# Patient Record
Sex: Female | Born: 1960 | Race: Black or African American | Hispanic: No | Marital: Married | Smoking: Never smoker
Health system: Southern US, Community
[De-identification: ages and names within clinical notes are randomized; demographics above are authoritative.]

## PROBLEM LIST (undated history)

## (undated) DIAGNOSIS — N189 Chronic kidney disease, unspecified: Secondary | ICD-10-CM

## (undated) DIAGNOSIS — D649 Anemia, unspecified: Secondary | ICD-10-CM

## (undated) DIAGNOSIS — I10 Essential (primary) hypertension: Secondary | ICD-10-CM

---

## 2016-07-07 ENCOUNTER — Inpatient Hospital Stay (HOSPITAL_COMMUNITY)
Admission: AD | Admit: 2016-07-07 | Discharge: 2016-07-15 | DRG: 070 | Disposition: A | Discharge status: Skilled Nursing Facility | Payer: Medicare HMO | Source: Other Acute Inpatient Hospital | Attending: Internal Medicine | Admitting: Internal Medicine

## 2016-07-07 DIAGNOSIS — J9602 Acute respiratory failure with hypercapnia: Secondary | ICD-10-CM | POA: Diagnosis not present

## 2016-07-07 DIAGNOSIS — L8990 Pressure ulcer of unspecified site, unspecified stage: Secondary | ICD-10-CM | POA: Diagnosis not present

## 2016-07-07 DIAGNOSIS — R0902 Hypoxemia: Secondary | ICD-10-CM

## 2016-07-07 DIAGNOSIS — I824Y3 Acute embolism and thrombosis of unspecified deep veins of proximal lower extremity, bilateral: Secondary | ICD-10-CM | POA: Diagnosis not present

## 2016-07-07 DIAGNOSIS — R8271 Bacteriuria: Secondary | ICD-10-CM | POA: Diagnosis not present

## 2016-07-07 DIAGNOSIS — Z6841 Body Mass Index (BMI) 40.0 and over, adult: Secondary | ICD-10-CM | POA: Diagnosis not present

## 2016-07-07 DIAGNOSIS — E875 Hyperkalemia: Secondary | ICD-10-CM | POA: Diagnosis present

## 2016-07-07 DIAGNOSIS — J9621 Acute and chronic respiratory failure with hypoxia: Secondary | ICD-10-CM | POA: Diagnosis present

## 2016-07-07 DIAGNOSIS — D72829 Elevated white blood cell count, unspecified: Secondary | ICD-10-CM

## 2016-07-07 DIAGNOSIS — E039 Hypothyroidism, unspecified: Secondary | ICD-10-CM | POA: Diagnosis present

## 2016-07-07 DIAGNOSIS — J9622 Acute and chronic respiratory failure with hypercapnia: Secondary | ICD-10-CM | POA: Diagnosis present

## 2016-07-07 DIAGNOSIS — F329 Major depressive disorder, single episode, unspecified: Secondary | ICD-10-CM | POA: Diagnosis not present

## 2016-07-07 DIAGNOSIS — N189 Chronic kidney disease, unspecified: Secondary | ICD-10-CM

## 2016-07-07 DIAGNOSIS — J9600 Acute respiratory failure, unspecified whether with hypoxia or hypercapnia: Secondary | ICD-10-CM

## 2016-07-07 DIAGNOSIS — E872 Acidosis: Secondary | ICD-10-CM | POA: Diagnosis present

## 2016-07-07 DIAGNOSIS — D631 Anemia in chronic kidney disease: Secondary | ICD-10-CM | POA: Diagnosis not present

## 2016-07-07 DIAGNOSIS — R253 Fasciculation: Secondary | ICD-10-CM

## 2016-07-07 DIAGNOSIS — I509 Heart failure, unspecified: Secondary | ICD-10-CM | POA: Diagnosis present

## 2016-07-07 DIAGNOSIS — G9341 Metabolic encephalopathy: Secondary | ICD-10-CM | POA: Diagnosis present

## 2016-07-07 DIAGNOSIS — I132 Hypertensive heart and chronic kidney disease with heart failure and with stage 5 chronic kidney disease, or end stage renal disease: Secondary | ICD-10-CM | POA: Diagnosis present

## 2016-07-07 DIAGNOSIS — J9601 Acute respiratory failure with hypoxia: Secondary | ICD-10-CM | POA: Diagnosis not present

## 2016-07-07 DIAGNOSIS — R4182 Altered mental status, unspecified: Secondary | ICD-10-CM | POA: Diagnosis not present

## 2016-07-07 DIAGNOSIS — A0472 Enterocolitis due to Clostridium difficile, not specified as recurrent: Secondary | ICD-10-CM | POA: Diagnosis present

## 2016-07-07 DIAGNOSIS — N179 Acute kidney failure, unspecified: Secondary | ICD-10-CM | POA: Diagnosis present

## 2016-07-07 DIAGNOSIS — Z9889 Other specified postprocedural states: Secondary | ICD-10-CM | POA: Diagnosis not present

## 2016-07-07 DIAGNOSIS — L89159 Pressure ulcer of sacral region, unspecified stage: Secondary | ICD-10-CM | POA: Diagnosis present

## 2016-07-07 DIAGNOSIS — S81801D Unspecified open wound, right lower leg, subsequent encounter: Secondary | ICD-10-CM | POA: Diagnosis not present

## 2016-07-07 DIAGNOSIS — N186 End stage renal disease: Secondary | ICD-10-CM | POA: Diagnosis present

## 2016-07-07 DIAGNOSIS — G4733 Obstructive sleep apnea (adult) (pediatric): Secondary | ICD-10-CM | POA: Diagnosis present

## 2016-07-07 DIAGNOSIS — G9340 Encephalopathy, unspecified: Secondary | ICD-10-CM | POA: Diagnosis not present

## 2016-07-07 DIAGNOSIS — R1110 Vomiting, unspecified: Secondary | ICD-10-CM

## 2016-07-07 DIAGNOSIS — D649 Anemia, unspecified: Secondary | ICD-10-CM | POA: Diagnosis not present

## 2016-07-07 DIAGNOSIS — Z452 Encounter for adjustment and management of vascular access device: Secondary | ICD-10-CM

## 2016-07-07 DIAGNOSIS — S81801A Unspecified open wound, right lower leg, initial encounter: Secondary | ICD-10-CM | POA: Diagnosis present

## 2016-07-07 HISTORY — DX: Anemia, unspecified: D64.9

## 2016-07-07 HISTORY — DX: Essential (primary) hypertension: I10

## 2016-07-07 HISTORY — DX: Chronic kidney disease, unspecified: N18.9

## 2016-07-08 ENCOUNTER — Inpatient Hospital Stay (HOSPITAL_COMMUNITY): Payer: Medicare HMO

## 2016-07-08 ENCOUNTER — Encounter (HOSPITAL_COMMUNITY): Payer: Self-pay | Admitting: Internal Medicine

## 2016-07-08 DIAGNOSIS — G9340 Encephalopathy, unspecified: Secondary | ICD-10-CM | POA: Diagnosis present

## 2016-07-08 DIAGNOSIS — S81801A Unspecified open wound, right lower leg, initial encounter: Secondary | ICD-10-CM | POA: Diagnosis present

## 2016-07-08 DIAGNOSIS — E039 Hypothyroidism, unspecified: Secondary | ICD-10-CM | POA: Diagnosis present

## 2016-07-08 DIAGNOSIS — D631 Anemia in chronic kidney disease: Secondary | ICD-10-CM | POA: Diagnosis present

## 2016-07-08 DIAGNOSIS — N186 End stage renal disease: Secondary | ICD-10-CM

## 2016-07-08 DIAGNOSIS — N179 Acute kidney failure, unspecified: Secondary | ICD-10-CM | POA: Diagnosis present

## 2016-07-08 DIAGNOSIS — N189 Chronic kidney disease, unspecified: Secondary | ICD-10-CM

## 2016-07-08 DIAGNOSIS — E875 Hyperkalemia: Secondary | ICD-10-CM

## 2016-07-08 LAB — CBC WITH DIFFERENTIAL/PLATELET
BASOS PCT: 1 %
Basophils Absolute: 0.1 10*3/uL (ref 0.0–0.1)
EOS PCT: 1 %
Eosinophils Absolute: 0.1 10*3/uL (ref 0.0–0.7)
HEMATOCRIT: 30.6 % — AB (ref 36.0–46.0)
Hemoglobin: 9.2 g/dL — ABNORMAL LOW (ref 12.0–15.0)
LYMPHS ABS: 1.6 10*3/uL (ref 0.7–4.0)
Lymphocytes Relative: 15 %
MCH: 25.3 pg — ABNORMAL LOW (ref 26.0–34.0)
MCHC: 30.1 g/dL (ref 30.0–36.0)
MCV: 84.3 fL (ref 78.0–100.0)
MONO ABS: 0.6 10*3/uL (ref 0.1–1.0)
Monocytes Relative: 6 %
Neutro Abs: 8.3 10*3/uL — ABNORMAL HIGH (ref 1.7–7.7)
Neutrophils Relative %: 77 %
Platelets: 620 10*3/uL — ABNORMAL HIGH (ref 150–400)
RBC: 3.63 MIL/uL — ABNORMAL LOW (ref 3.87–5.11)
RDW: 22.3 % — AB (ref 11.5–15.5)
WBC: 10.7 10*3/uL — AB (ref 4.0–10.5)

## 2016-07-08 LAB — HEPATIC FUNCTION PANEL
ALT: 10 U/L — ABNORMAL LOW (ref 14–54)
AST: 26 U/L (ref 15–41)
Albumin: 2.4 g/dL — ABNORMAL LOW (ref 3.5–5.0)
Alkaline Phosphatase: 84 U/L (ref 38–126)
BILIRUBIN TOTAL: 0.4 mg/dL (ref 0.3–1.2)
Total Protein: 7.5 g/dL (ref 6.5–8.1)

## 2016-07-08 LAB — MRSA PCR SCREENING: MRSA by PCR: NEGATIVE

## 2016-07-08 LAB — RENAL FUNCTION PANEL
Albumin: 2.4 g/dL — ABNORMAL LOW (ref 3.5–5.0)
Anion gap: 13 (ref 5–15)
BUN: 90 mg/dL — AB (ref 6–20)
CO2: 24 mmol/L (ref 22–32)
CREATININE: 5.37 mg/dL — AB (ref 0.44–1.00)
Calcium: 6.8 mg/dL — ABNORMAL LOW (ref 8.9–10.3)
Chloride: 93 mmol/L — ABNORMAL LOW (ref 101–111)
GFR calc Af Amer: 9 mL/min — ABNORMAL LOW (ref >60)
GFR calc non Af Amer: 8 mL/min — ABNORMAL LOW (ref >60)
GLUCOSE: 96 mg/dL (ref 65–99)
PHOSPHORUS: 10 mg/dL — AB (ref 2.5–4.6)
Potassium: 6.1 mmol/L — ABNORMAL HIGH (ref 3.5–5.1)
Sodium: 130 mmol/L — ABNORMAL LOW (ref 135–145)

## 2016-07-08 LAB — BASIC METABOLIC PANEL
ANION GAP: 12 (ref 5–15)
Anion gap: 15 (ref 5–15)
BUN: 87 mg/dL — AB (ref 6–20)
BUN: 88 mg/dL — ABNORMAL HIGH (ref 6–20)
CALCIUM: 6.9 mg/dL — AB (ref 8.9–10.3)
CHLORIDE: 92 mmol/L — AB (ref 101–111)
CO2: 22 mmol/L (ref 22–32)
CO2: 26 mmol/L (ref 22–32)
CREATININE: 5.61 mg/dL — AB (ref 0.44–1.00)
Calcium: 6.9 mg/dL — ABNORMAL LOW (ref 8.9–10.3)
Chloride: 93 mmol/L — ABNORMAL LOW (ref 101–111)
Creatinine, Ser: 5.32 mg/dL — ABNORMAL HIGH (ref 0.44–1.00)
GFR calc Af Amer: 9 mL/min — ABNORMAL LOW (ref >60)
GFR calc non Af Amer: 8 mL/min — ABNORMAL LOW (ref >60)
GFR calc non Af Amer: 8 mL/min — ABNORMAL LOW (ref >60)
GFR, EST AFRICAN AMERICAN: 10 mL/min — AB (ref >60)
Glucose, Bld: 106 mg/dL — ABNORMAL HIGH (ref 65–99)
Glucose, Bld: 98 mg/dL (ref 65–99)
POTASSIUM: 6.1 mmol/L — AB (ref 3.5–5.1)
Potassium: 6.9 mmol/L (ref 3.5–5.1)
SODIUM: 129 mmol/L — AB (ref 135–145)
Sodium: 131 mmol/L — ABNORMAL LOW (ref 135–145)

## 2016-07-08 LAB — CBC
HEMATOCRIT: 30.4 % — AB (ref 36.0–46.0)
HEMATOCRIT: 31.3 % — AB (ref 36.0–46.0)
HEMOGLOBIN: 9.2 g/dL — AB (ref 12.0–15.0)
Hemoglobin: 9 g/dL — ABNORMAL LOW (ref 12.0–15.0)
MCH: 25.6 pg — AB (ref 26.0–34.0)
MCH: 24.7 pg — ABNORMAL LOW (ref 26.0–34.0)
MCHC: 30.3 g/dL (ref 30.0–36.0)
MCHC: 28.8 g/dL — AB (ref 30.0–36.0)
MCV: 84.4 fL (ref 78.0–100.0)
MCV: 86 fL (ref 78.0–100.0)
PLATELETS: 610 10*3/uL — AB (ref 150–400)
Platelets: 604 10*3/uL — ABNORMAL HIGH (ref 150–400)
RBC: 3.6 MIL/uL — ABNORMAL LOW (ref 3.87–5.11)
RBC: 3.64 MIL/uL — ABNORMAL LOW (ref 3.87–5.11)
RDW: 22.4 % — AB (ref 11.5–15.5)
RDW: 22.7 % — AB (ref 11.5–15.5)
WBC: 10 10*3/uL (ref 4.0–10.5)
WBC: 9.3 10*3/uL (ref 4.0–10.5)

## 2016-07-08 LAB — HIV ANTIBODY (ROUTINE TESTING W REFLEX): HIV SCREEN 4TH GENERATION: NONREACTIVE

## 2016-07-08 LAB — LACTIC ACID, PLASMA: LACTIC ACID, VENOUS: 0.9 mmol/L (ref 0.5–1.9)

## 2016-07-08 LAB — BLOOD GAS, ARTERIAL
ACID-BASE DEFICIT: 2.5 mmol/L — AB (ref 0.0–2.0)
BICARBONATE: 23.9 mmol/L (ref 20.0–28.0)
Drawn by: 365271
FIO2: 21
O2 SAT: 84.1 %
PATIENT TEMPERATURE: 98.6
PO2 ART: 55.2 mmHg — AB (ref 83.0–108.0)
pCO2 arterial: 58.3 mmHg — ABNORMAL HIGH (ref 32.0–48.0)
pH, Arterial: 7.237 — ABNORMAL LOW (ref 7.350–7.450)

## 2016-07-08 LAB — GLUCOSE, CAPILLARY
GLUCOSE-CAPILLARY: 114 mg/dL — AB (ref 65–99)
GLUCOSE-CAPILLARY: 95 mg/dL (ref 65–99)
Glucose-Capillary: 101 mg/dL — ABNORMAL HIGH (ref 65–99)
Glucose-Capillary: 106 mg/dL — ABNORMAL HIGH (ref 65–99)
Glucose-Capillary: 106 mg/dL — ABNORMAL HIGH (ref 65–99)

## 2016-07-08 LAB — PROCALCITONIN: Procalcitonin: 1.56 ng/mL

## 2016-07-08 LAB — TSH: TSH: 13.595 u[IU]/mL — ABNORMAL HIGH (ref 0.350–4.500)

## 2016-07-08 LAB — AMMONIA: Ammonia: 29 umol/L (ref 9–35)

## 2016-07-08 LAB — HCG, SERUM, QUALITATIVE: PREG SERUM: NEGATIVE

## 2016-07-08 LAB — BRAIN NATRIURETIC PEPTIDE: B Natriuretic Peptide: 76.1 pg/mL (ref 0.0–100.0)

## 2016-07-08 MED ORDER — ALTEPLASE 2 MG IJ SOLR: 2.0000 mg | Freq: Once | INTRAMUSCULAR | Status: DC | PRN

## 2016-07-08 MED ORDER — ONDANSETRON HCL 4 MG PO TABS
4.0000 mg | ORAL_TABLET | Freq: Four times a day (QID) | ORAL | Status: DC | PRN
  Administered 2016-07-13: 4 mg via ORAL
  Filled 2016-07-08: qty 1

## 2016-07-08 MED ORDER — DEXTROSE 50 % IV SOLN
1.0000 | Freq: Once | INTRAVENOUS | Status: AC
  Administered 2016-07-08: 50 mL via INTRAVENOUS
  Filled 2016-07-08: qty 50

## 2016-07-08 MED ORDER — HEPARIN SODIUM (PORCINE) 1000 UNIT/ML DIALYSIS: 1000.0000 [IU] | INTRAMUSCULAR | Status: DC | PRN

## 2016-07-08 MED ORDER — LIDOCAINE HCL (PF) 1 % IJ SOLN: 5.0000 mL | INTRAMUSCULAR | Status: DC | PRN

## 2016-07-08 MED ORDER — ONDANSETRON HCL 4 MG/2ML IJ SOLN
4.0000 mg | Freq: Four times a day (QID) | INTRAMUSCULAR | Status: DC | PRN
  Administered 2016-07-12 – 2016-07-13 (×3): 4 mg via INTRAVENOUS
  Filled 2016-07-08 (×4): qty 2

## 2016-07-08 MED ORDER — CALCIUM GLUCONATE 10 % IV SOLN: 1.0000 g | Freq: Once | INTRAVENOUS | Status: DC

## 2016-07-08 MED ORDER — LIDOCAINE-PRILOCAINE 2.5-2.5 % EX CREA
1.0000 "application " | TOPICAL_CREAM | CUTANEOUS | Status: DC | PRN
  Filled 2016-07-08: qty 5

## 2016-07-08 MED ORDER — ACETAMINOPHEN 325 MG PO TABS
650.0000 mg | ORAL_TABLET | Freq: Four times a day (QID) | ORAL | Status: DC | PRN
  Administered 2016-07-09 – 2016-07-12 (×6): 650 mg via ORAL
  Filled 2016-07-08 (×6): qty 2

## 2016-07-08 MED ORDER — PENTAFLUOROPROP-TETRAFLUOROETH EX AERO: 1.0000 "application " | INHALATION_SPRAY | CUTANEOUS | Status: DC | PRN

## 2016-07-08 MED ORDER — FENTANYL CITRATE (PF) 100 MCG/2ML IJ SOLN
INTRAMUSCULAR | Status: AC
  Filled 2016-07-08: qty 2

## 2016-07-08 MED ORDER — LEVOTHYROXINE SODIUM 100 MCG IV SOLR
100.0000 ug | Freq: Every day | INTRAVENOUS | Status: DC
  Administered 2016-07-08 – 2016-07-12 (×5): 100 ug via INTRAVENOUS
  Filled 2016-07-08 (×5): qty 5

## 2016-07-08 MED ORDER — ACETAMINOPHEN 650 MG RE SUPP: 650.0000 mg | Freq: Four times a day (QID) | RECTAL | Status: DC | PRN

## 2016-07-08 MED ORDER — SODIUM POLYSTYRENE SULFONATE 15 GM/60ML PO SUSP
45.0000 g | Freq: Once | ORAL | Status: AC
  Administered 2016-07-08: 45 g via RECTAL
  Filled 2016-07-08: qty 180

## 2016-07-08 MED ORDER — SODIUM CHLORIDE 0.9 % IV SOLN: 100.0000 mL | INTRAVENOUS | Status: DC | PRN

## 2016-07-08 MED ORDER — INSULIN ASPART 100 UNIT/ML IV SOLN
5.0000 [IU] | Freq: Once | INTRAVENOUS | Status: AC
  Administered 2016-07-08: 5 [IU] via INTRAVENOUS

## 2016-07-08 MED ORDER — SODIUM CHLORIDE 0.9 % IV SOLN
1.0000 g | Freq: Once | INTRAVENOUS | Status: AC
  Administered 2016-07-08: 1 g via INTRAVENOUS
  Filled 2016-07-08: qty 10

## 2016-07-08 MED ORDER — INSULIN ASPART 100 UNIT/ML ~~LOC~~ SOLN
0.0000 [IU] | SUBCUTANEOUS | Status: DC
  Administered 2016-07-15: 1 [IU] via SUBCUTANEOUS

## 2016-07-08 MED ORDER — HEPARIN SODIUM (PORCINE) 5000 UNIT/ML IJ SOLN
5000.0000 [IU] | Freq: Three times a day (TID) | INTRAMUSCULAR | Status: DC
  Administered 2016-07-08 – 2016-07-12 (×13): 5000 [IU] via SUBCUTANEOUS
  Filled 2016-07-08 (×13): qty 1

## 2016-07-08 NOTE — Progress Notes (Signed)
Pt admitted to unit from 2W @0345 . Pt has multiple wounds to buttocks, inner/outer thighs, abdominal folds and bilateral groins.FOam dressings applied as best as possible.

## 2016-07-08 NOTE — Progress Notes (Signed)
PROGRESS NOTE                                                                                                                                                                                                             Diamond Parsons Demographics:    Diamond Parsons, is a 56 y.o. female, DOB - 16-Jul-1960, ZOX:[id number]  Admit date - 07/07/2016   Admitting Physician Eduard Clos, MD  Outpatient Primary MD for the Diamond Parsons is Diamond Parsons, No Pcp Per  LOS - 1   No chief complaint on file.      Brief Narrative   Diamond Parsons admitted earlier today by Dr. Georgetta Haber, chart, labs, imaging were reviewed   Subjective:    Diamond Parsons today Is confused and lethargic, cannot provide any complaints .   Assessment  & Plan :    Principal Problem:   Acute encephalopathy Active Problems:   Acute on chronic renal failure (HCC)   Leg wound, right   Hypothyroidism   Hyperkalemia   Anemia due to end stage renal disease (HCC)   Acute hypoxic/hypercapnic respiratory failure - Continue with BiPAP when necessary  Acute renal failure - Unclear baseline, even though recent hospitalization at [facility], appears no recent labs in system, records were requested - She was on hydrochlorothiazide, Timoptic, lisinopril, which was certainly contributing to her anal failure, she required dialysis during hospital stay and [place], but there was no need for further dialysis on discharge, and with evidence of uremia, hyperkalemia, hemodialysis will be started today. Renal.  Sacral decubitus ulcer - Continue with wound care,   Peripheral vascular disease with right foot amputation - Wound with some dehiscence , wound care consulted  Encephalopathy - Metabolic versus uremic  Hypothyroidism - Continue Synthroid  Diabetes mellitus - Continue with aspirin sliding scale  Hypertension - blood Pressure is acceptable off medication  Code Status : Full  Family  Communication  : Discussed with son via phone  Disposition Plan  : Back to SNF once stable   Consults  : Renal , PCCM  Procedures  : Temporary dialysis catheter insertion  DVT Prophylaxis  :   Heparin   Lab Results  Component Value Date   PLT 610 (H) 07/08/2016    Antibiotics  :    Anti-infectives    None  Objective:   Vitals:   07/08/16 1000 07/08/16 1215 07/08/16 1220 07/08/16 1230  BP: 121/72 112/63 108/72 107/72  Pulse: 95 96    Resp: 14 14 (!) 6 13  Temp:    97.5 F (36.4 C)  TempSrc:    Oral  SpO2: 100% 100%    Weight:      Height:        Wt Readings from Last 3 Encounters:  07/08/16 (!) 201.4 kg (444 lb)     Intake/Output Summary (Last 24 hours) at 07/08/16 1337 Last data filed at 07/08/16 0615  Gross per 24 hour  Intake              110 ml  Output              450 ml  Net             -340 ml     Physical Exam  Awake , Opens eyes, lethargic and confused , on BiPAP  Symmetrical Chest wall movement, distant respiratory sounds, diminished air entry at the bases, some crackles at the bases RRR,No Gallops,Rubs or new Murmurs, No Parasternal Heave +ve B.Sounds, Abd Soft, No tenderness, mildly obese abdomen, No rebound - guarding or rigidity. +1 edema and left lower extremity, right leg bandaged status post foot amputation    Data Review:    CBC  Recent Labs Lab 07/08/16 0128 07/08/16 0753 07/08/16 1102  WBC 10.7* 10.0 9.3  HGB 9.2* 9.2* 9.0*  HCT 30.6* 30.4* 31.3*  PLT 620* 604* 610*  MCV 84.3 84.4 86.0  MCH 25.3* 25.6* 24.7*  MCHC 30.1 30.3 28.8*  RDW 22.3* 22.4* 22.7*  LYMPHSABS 1.6  --   --   MONOABS 0.6  --   --   EOSABS 0.1  --   --   BASOSABS 0.1  --   --     Chemistries   Recent Labs Lab 07/08/16 0128 07/08/16 0753 07/08/16 1102  NA 129* 131* 130*  K 6.9* 6.1* 6.1*  CL 92* 93* 93*  CO2 22 26 24   GLUCOSE 106* 98 96  BUN 87* 88* 90*  CREATININE 5.61* 5.32* 5.37*  CALCIUM 6.9* 6.9* 6.8*  AST 26  --   --     ALT 10*  --   --   ALKPHOS 84  --   --   BILITOT 0.4  --   --    ------------------------------------------------------------------------------------------------------------------ No results for input(s): CHOL, HDL, LDLCALC, TRIG, CHOLHDL, LDLDIRECT in the last 72 hours.  No results found for: HGBA1C ------------------------------------------------------------------------------------------------------------------  Recent Labs  07/08/16 0753  TSH 13.595*   ------------------------------------------------------------------------------------------------------------------ No results for input(s): VITAMINB12, FOLATE, FERRITIN, TIBC, IRON, RETICCTPCT in the last 72 hours.  Coagulation profile No results for input(s): INR, PROTIME in the last 168 hours.  No results for input(s): DDIMER in the last 72 hours.  Cardiac Enzymes No results for input(s): CKMB, TROPONINI, MYOGLOBIN in the last 168 hours.  Invalid input(s): CK ------------------------------------------------------------------------------------------------------------------    Component Value Date/Time   BNP 76.1 07/08/2016 0753    Inpatient Medications  Scheduled Meds: . fentaNYL      . heparin  5,000 Units Subcutaneous Q8H  . insulin aspart  0-9 Units Subcutaneous Q4H  . levothyroxine  100 mcg Intravenous Daily   Continuous Infusions: . sodium chloride    . sodium chloride     PRN Meds:.sodium chloride, sodium chloride, acetaminophen **OR** acetaminophen, alteplase, heparin, lidocaine (PF), lidocaine-prilocaine, ondansetron **OR** ondansetron (ZOFRAN) IV, pentafluoroprop-tetrafluoroeth  Micro Results Recent Results (from the past 240 hour(s))  MRSA PCR Screening     Status: None   Collection Time: 07/08/16  4:18 AM  Result Value Ref Range Status   MRSA by PCR NEGATIVE NEGATIVE Final    Comment:        The GeneXpert MRSA Assay (FDA approved for NASAL specimens only), is one component of a comprehensive  MRSA colonization surveillance program. It is not intended to diagnose MRSA infection nor to guide or monitor treatment for MRSA infections.     Radiology Reports Dg Chest Port 1 View  Result Date: 07/08/2016 CLINICAL DATA:  Encounter for central line placement. EXAM: PORTABLE CHEST 1 VIEW COMPARISON:  Radiograph of same day. FINDINGS: Stable cardiomegaly with central pulmonary vascular congestion. No pneumothorax or pleural effusion is noted. Interval placement of right internal jugular catheter with distal tip in expected position of SVC. Bony thorax is unremarkable. IMPRESSION: Stable cardiomegaly with central pulmonary vascular congestion. Interval placement of right internal jugular catheter with distal tip in expected position of SVC. Electronically Signed   By: Lupita RaiderJames  Green Jr, M.D.   On: 07/08/2016 13:16   Dg Chest Port 1 View  Result Date: 07/08/2016 CLINICAL DATA:  Hypoxia EXAM: PORTABLE CHEST 1 VIEW COMPARISON:  None. FINDINGS: There is no edema or consolidation. There is cardiomegaly with pulmonary venous hypertension. No adenopathy. Moderate air is noted in the upper esophagus. Visualized trachea appears unremarkable. No bone lesions. IMPRESSION: Evidence pulmonary vascular congestion without edema or consolidation. Air in upper esophagus of uncertain etiology. Electronically Signed   By: Bretta BangWilliam  Woodruff III M.D.   On: 07/08/2016 08:11     Keyarra Rendall M.D on 07/08/2016 at 1:37 PM  Between 7am to 7pm - Pager - [phone]  After 7pm go to [email or web address] - password [facility]  [facility] -  Office  [phone]

## 2016-07-08 NOTE — Progress Notes (Signed)
Called pt's son that [facility]

## 2016-07-08 NOTE — Progress Notes (Signed)
Dr. Randol KernElgergawy notified of wound consult nurse recommendations (see consult note). Also notified HD cath placed at bedside and plan for dialysis at bedside this afternoon.

## 2016-07-08 NOTE — Progress Notes (Signed)
eLink Physician-Brief Progress Note Patient Name: Diamond Parsons DOB: 10/28/1960 MRN: [id number]   Date of Service  07/08/2016  HPI/Events of Note  PCCM consulted for encephalopathy and biPAP 56 yo morbidly obese female with progressive renal failure with high K with acute encephalopathy from metabolic and resp acidosis  and uremia Placed on biPAP, patient will need HD   eICU Interventions  Step down status BiPAP Needs HD     Intervention Category Evaluation Type: New Patient Evaluation  Diamond Parsons 07/08/2016, 5:27 AM

## 2016-07-08 NOTE — Procedures (Addendum)
Hemodialysis Catheter Insertion Procedure Note Diamond Parsons [id number] 07/22/1960  Procedure: Insertion of Hemodialysis Catheter Indications: Hemodialysis  Procedure Details Consent: Risks of procedure as well as the alternatives and risks of each were explained to the (patient/caregiver).  Consent for procedure obtained.  Time Out: Verified patient identification, verified procedure, site/side was marked, verified correct patient position, special equipment/implants available, medications/allergies/relevent history reviewed, required imaging and test results available.  Performed  Maximum sterile technique was used including antiseptics, cap, gloves, gown, hand hygiene, mask and sheet.  Skin prep: Chlorhexidine; local anesthetic administered  A Trialysis HD catheter was placed in the right internal jugular vein using the Seldinger technique.  Evaluation Blood flow good Complications: No apparent complications Patient did tolerate procedure well. Chest X-ray ordered to verify placement.  CXR: pending.   Procedure performed with ultrasound guidance for real time vessel cannulation.     Diamond KussmaulKatalina Parsons, AG-ACNP [facility]  Pgr: [phone]  PCCM Pgr: [phone]  Attending note: I was present for the procedure  Chilton GreathousePraveen Raychel Dowler MD [facility] Pager [phone] If no answer or after 3pm call: [phone] 07/09/2016, 8:21 AM

## 2016-07-08 NOTE — Progress Notes (Signed)
Pt transferred up to 4N07 due to needing higher level of care. She was accompanied by RN, nurse tech, and respiratory. Pt was stable during transfer and received by pt's nurse.

## 2016-07-08 NOTE — Consult Note (Addendum)
WOC Nurse wound consult note Reason for Consult: surgical leg wound, IAD perineal and buttocks areas, MASD skin folds due to morbid obesity. Wound type:partial thickness, incisional, MASD, IAD Pressure Injury POA: Yes, left buttock Measurement:Right transmetarsal amp inc site: 15cm inc (24 staples) close approximation except area 3cm from medial edge has 0.5 round bleeding area with black discoloration around inc edges, greater at the lateral edge with a 4cm hard necrotic area. Perineal and buttocks have more than a dozen partial thickness wounds from incontinence and moisture in skin folds with the largest being 4cm x 3cm x 0.1cm and a string of partial thickness wounds on the left buttock in the shape of a bedpan. Right groin has two partial thickness questionable surgical site from previous admission or maybe previous hemo dialysis cath site. Distal portion is 3.5cm x 3.0cm x 0.2cm proximal portion 1.5 round, red granulating tissue, yellow drainage, no odor. Left medial thigh 6cm x 0.5cm slit in skin, red tissue, no drainage or odor. Wound bed:see above Drainage (amount, consistency, odor) see above Periwound:intact Dressing procedure/placement/frequency: I have provided nurses with orders for NS wet to dry dressing for right groin, House antimicrobial for left groin, sub panicular and inframammary areas. I have ordered 3 step cleansing regime for IAD on buttocks and perineal areas with purple top Criticaid lotion. Pt is currently on an air mattress in ICU and will need one ordered when moves out to the floor. Prevalon boots ordered bilaterally. Patient may benefit from consultation with either VVS or Orthopedic Surgery for ongoing evaluation of right foot incision line. If you agree, please order. We will not follow, but will remain available to this patient, to nursing, and the medical and/or surgical teams.  Please re-consult if we need to assist further.    Barnett HatterMelinda Jams Trickett, RN-C, WTA-C Wound  Treatment Associate

## 2016-07-08 NOTE — Consult Note (Signed)
[facility] Consult Note     Date: 07/08/2016                  Patient Name:  Diamond Parsons  MRN: [id number]  DOB: 1960-02-15  Age / Sex: 56 y.o., female         PCP: Patient, No Pcp Per                 Service Requesting Consult: [facility], Dr. Waldron Labs                 Reason for Consult: AKI and hyperkalemia            Chief Complaint: acute encephalopathy  HPI: Pt is a 23F with a PMH of HTN, CKD, morbid obesity, and an episode of AKI previously requiring dialysis who is now seen at the request of Dr. Waldron Labs for eval and recommendations surrounding AKI, encephalopathy, and hyperkalemia.    Pt is encephalopathic and is unable to provide any history.  There is not a lot from the paper chart either.    Apparently she presented from a SNF to [facility] for R shoulder pain and chest pain and encephalopathy. She was found to have AKI with Cr of of 6.20 and a K of 6.6.  This was medically treated and she was transferred here.    Her K is 6.9 and Cr is 5.8.  Has been making some urine.  Has been hypercarbic and is now on BiPaP.  No details are available about her previous dialysis sessions.  These were apparently in the setting of a R foot infection.  MAR from SNF denotes HCTZ, lisinopril, and Mobic.  Past Medical History:  Diagnosis Date  . Anemia   . Chronic kidney disease   . Hypertension     History reviewed. No pertinent surgical history.  Family History  Problem Relation Age of Onset  . Family history unknown: Yes   Social History:  reports that she has never smoked. She has never used smokeless tobacco. Her alcohol and drug histories are not on file.  Allergies: Not on File  No prescriptions prior to admission.    Results for orders placed or performed during the hospital encounter of 07/07/16 (from the past 48 hour(s))  Basic metabolic panel     Status: Abnormal   Collection Time: 07/08/16  1:28 AM  Result Value Ref Range   Sodium 129 (L) 135 -  145 mmol/L   Potassium 6.9 (HH) 3.5 - 5.1 mmol/L    Comment: NO VISIBLE HEMOLYSIS CRITICAL RESULT CALLED TO, READ BACK BY AND VERIFIED WITH: S.LABANG,RN 0225 07/08/16 M.CAMPBELL    Chloride 92 (L) 101 - 111 mmol/L   CO2 22 22 - 32 mmol/L   Glucose, Bld 106 (H) 65 - 99 mg/dL   BUN 87 (H) 6 - 20 mg/dL   Creatinine, Ser 5.61 (H) 0.44 - 1.00 mg/dL   Calcium 6.9 (L) 8.9 - 10.3 mg/dL   GFR calc non Af Amer 8 (L) >60 mL/min   GFR calc Af Amer 9 (L) >60 mL/min    Comment: (NOTE) The eGFR has been calculated using the CKD EPI equation. This calculation has not been validated in all clinical situations. eGFR's persistently <60 mL/min signify possible Chronic Kidney Disease.    Anion gap 15 5 - 15  CBC with Differential/Platelet     Status: Abnormal   Collection Time: 07/08/16  1:28 AM  Result Value Ref Range   WBC 10.7 (H) 4.0 -  10.5 K/uL    Comment: CORRECTED ON 06/21 AT 0143: PREVIOUSLY REPORTED AS 10.2   RBC 3.63 (L) 3.87 - 5.11 MIL/uL    Comment: CORRECTED ON 06/21 AT 0143: PREVIOUSLY REPORTED AS 3.66   Hemoglobin 9.2 (L) 12.0 - 15.0 g/dL    Comment: CORRECTED ON 06/21 AT 0143: PREVIOUSLY REPORTED AS 9.4   HCT 30.6 (L) 36.0 - 46.0 %    Comment: CORRECTED ON 06/21 AT 0143: PREVIOUSLY REPORTED AS 31.2   MCV 84.3 78.0 - 100.0 fL    Comment: CORRECTED ON 06/21 AT 0143: PREVIOUSLY REPORTED AS 85.2   MCH 25.3 (L) 26.0 - 34.0 pg    Comment: CORRECTED ON 06/21 AT 0143: PREVIOUSLY REPORTED AS 25.7   MCHC 30.1 30.0 - 36.0 g/dL   RDW 22.3 (H) 11.5 - 15.5 %    Comment: CORRECTED ON 06/21 AT 0143: PREVIOUSLY REPORTED AS 22.6   Platelets 620 (H) 150 - 400 K/uL    Comment: CORRECTED ON 06/21 AT 0143: PREVIOUSLY REPORTED AS 619   Neutrophils Relative % 77 %   Lymphocytes Relative 15 %   Monocytes Relative 6 %   Eosinophils Relative 1 %   Basophils Relative 1 %   Neutro Abs 8.3 (H) 1.7 - 7.7 K/uL   Lymphs Abs 1.6 0.7 - 4.0 K/uL   Monocytes Absolute 0.6 0.1 - 1.0 K/uL   Eosinophils Absolute  0.1 0.0 - 0.7 K/uL   Basophils Absolute 0.1 0.0 - 0.1 K/uL   RBC Morphology POLYCHROMASIA PRESENT     Comment: RARE NRBCs   WBC Morphology MILD LEFT SHIFT (1-5% METAS, OCC MYELO, OCC BANDS)   Hepatic function panel     Status: Abnormal   Collection Time: 07/08/16  1:28 AM  Result Value Ref Range   Total Protein 7.5 6.5 - 8.1 g/dL   Albumin 2.4 (L) 3.5 - 5.0 g/dL   AST 26 15 - 41 U/L   ALT 10 (L) 14 - 54 U/L   Alkaline Phosphatase 84 38 - 126 U/L   Total Bilirubin 0.4 0.3 - 1.2 mg/dL   Bilirubin, Direct <0.1 (L) 0.1 - 0.5 mg/dL   Indirect Bilirubin NOT CALCULATED 0.3 - 0.9 mg/dL  Lactic acid, plasma     Status: None   Collection Time: 07/08/16  1:28 AM  Result Value Ref Range   Lactic Acid, Venous 0.9 0.5 - 1.9 mmol/L  hCG, serum, qualitative     Status: None   Collection Time: 07/08/16  1:28 AM  Result Value Ref Range   Preg, Serum NEGATIVE NEGATIVE    Comment:        THE SENSITIVITY OF THIS METHODOLOGY IS >10 mIU/mL.   Blood gas, arterial     Status: Abnormal   Collection Time: 07/08/16  1:28 AM  Result Value Ref Range   FIO2 21.00    pH, Arterial 7.237 (L) 7.350 - 7.450   pCO2 arterial 58.3 (H) 32.0 - 48.0 mmHg   pO2, Arterial 55.2 (L) 83.0 - 108.0 mmHg   Bicarbonate 23.9 20.0 - 28.0 mmol/L   Acid-base deficit 2.5 (H) 0.0 - 2.0 mmol/L   O2 Saturation 84.1 %   Patient temperature 98.6    Collection site LEFT RADIAL    Drawn by [id number]    Sample type ARTERIAL DRAW    Allens test (pass/fail) PASS PASS  MRSA PCR Screening     Status: None   Collection Time: 07/08/16  4:18 AM  Result Value Ref Range   MRSA by  PCR NEGATIVE NEGATIVE    Comment:        The GeneXpert MRSA Assay (FDA approved for NASAL specimens only), is one component of a comprehensive MRSA colonization surveillance program. It is not intended to diagnose MRSA infection nor to guide or monitor treatment for MRSA infections.   Glucose, capillary     Status: Abnormal   Collection Time: 07/08/16  4:40  AM  Result Value Ref Range   Glucose-Capillary 114 (H) 65 - 99 mg/dL  Glucose, capillary     Status: Abnormal   Collection Time: 07/08/16  7:55 AM  Result Value Ref Range   Glucose-Capillary 101 (H) 65 - 99 mg/dL   Dg Chest Port 1 View  Result Date: 07/08/2016 CLINICAL DATA:  Hypoxia EXAM: PORTABLE CHEST 1 VIEW COMPARISON:  None. FINDINGS: There is no edema or consolidation. There is cardiomegaly with pulmonary venous hypertension. No adenopathy. Moderate air is noted in the upper esophagus. Visualized trachea appears unremarkable. No bone lesions. IMPRESSION: Evidence pulmonary vascular congestion without edema or consolidation. Air in upper esophagus of uncertain etiology. Electronically Signed   By: Lowella Grip III M.D.   On: 07/08/2016 08:11    ROS: unobtainable due to mental status  Blood pressure 102/74, pulse 98, resp. rate 13, weight (!) 201.4 kg (444 lb), SpO2 100 %. Physical Exam  GEN encephalopathic, opens eyes  HEENT sclerae anicteric BiPaP in place NECK no JVD PULM bilateral anterior rhonchi CV RRR ABD obese EXT 1+ edema NEURO encephalopathic SKIN no rashes on exposed skin; I was unable to view this personally myself this AM  Assessment/Plan  1.  AKI: In setting of HCTZ, lisinopril, Mobic.  Unclear baseline.  She will need acute dialysis.  I have asked for the assistance of PCCM.  She is not a long term dialysis candidate.    2.  Sacral decub: will need wound care-- has VAC in place  3.  Hyperkalemia: expect this to improve with dialysis  4.  Encephalopathy: multifactorial, per primary   Madelon Lips, MD [facility] pgr [phone] 07/08/2016, 8:33 AM

## 2016-07-08 NOTE — Progress Notes (Addendum)
Initial Nutrition Assessment  DOCUMENTATION CODES:   Morbid obesity  INTERVENTION:   -RD will follow for diet advancement and supplement as appropriate  NUTRITION DIAGNOSIS:   Increased nutrient needs related to wound healing as evidenced by estimated needs.  GOAL:   Patient will meet greater than or equal to 90% of their needs  MONITOR:   Diet advancement, Labs, Weight trends, Skin, I & O's  REASON FOR ASSESSMENT:   Low Braden    ASSESSMENT:   Diamond Parsons is a 56 y.o. female with history of diabetes mellitus, hypertension, hypothyroidism anemia and chronic kidney disease who was admitted last month for right foot infection underwent amputation and is on wound VAC and during that admission as per the reports patient had a couple of sessions dialysis and was eventually discharged to rehabilitation. Patient's routine lab work showed patient has hyperkalemia and [facility].   Pt admitted with acute encephalopathy.   Pt unable to provide hx at time; currently on Bi-pap. No family present to provide additional hx.   Pert chart review, pt with wound vac PTA to rt leg. Pt appears to have a partial rt foot amputation. Also awaiting CWOCN evaluation for wounds.   Per CCM notes, HD will need to be initiated.   Nutrition-Focused physical exam completed. Findings are no fat depletion, no muscle depletion, and moderate edema.   Labs reviewed: Na: 131 (on IV supplementation), K: 6.1 CBGS: 101-114.   Diet Order:  Diet NPO time specified  Skin:  Wound (see comment) (stage II abdomen and buttocks)  Last BM:  07/08/16  Height:   Ht Readings from Last 1 Encounters:  07/08/16 5\' 4"  (1.626 m)    Weight:   Wt Readings from Last 1 Encounters:  07/08/16 (!) 444 lb (201.4 kg)    Ideal Body Weight:  54.5 kg  BMI:  Body mass index is 76.21 kg/m.  Estimated Nutritional Needs:   Kcal:  [id number]  Protein:  > 136 grams  Fluid:  > 1.2  L  EDUCATION NEEDS:   Education needs no appropriate at this time  Orion Mole A. Mayford KnifeWilliams, RD, LDN, CDE Pager: [phone] After hours Pager: [phone]

## 2016-07-08 NOTE — Progress Notes (Signed)
Diamond Parsons has [facility] (Dr. Littie DeedsGentry) for further evaluation and management of acute on chronic renal failure with hyperkalemia.  Per the report of Dr. Littie Deedsgentry, patient is a 500 pound female with hypertension, diabetes mellitus, and sacral decubiti on her bottom, living in SNF due to her size and resulting inability to care for herself. Labs at the nursing facility featured a serum potassium >7.5 and she was sent to the ED for further evaluation.  In the ED, she is noted to be afebrile with O2 sat duration 91% on room air and vitals otherwise stable. Chemistry panel revealed a potassium of 6.6 with BUN of 90 and serum creatinine of 6.2. Sodium was a little low at 132, but the chemistry panel, including bicarbonate, was otherwise normal. Patient had a CBC with anemia, hemoglobin 9.3, and platelets 615,000. EKG was reportedly unremarkable with no peaking of the T waves. Patient was treated with insulin/glucose, oral Kayexalate, and an ampule of bicarbonate. She was placed on 2 L/m supplemental oxygen, but her chest x-ray was reportedly normal and she is not tachypneic, but the sats were hovering around 91% on room air.   Of note, the patient reportedly had required dialysis briefly approximately one month ago, but her renal function had appeared to recover enough to stop HD.

## 2016-07-08 NOTE — H&P (Signed)
History and Physical    Diamond PizzaMonica Scritchfield ZOX:[id number] DOB: 04/30/1960 DOA: 07/07/2016  PCP: Patient, No Pcp Per  Patient coming from: Patient was transferred from [facility].  Chief Complaint: Hyperkalemia.  History obtained from accepting physician patient's nurse and records from [facility]. Patient is encephalopathic and no family at the bedside.  HPI: Diamond Parsons is a 56 y.o. female with history of diabetes mellitus, hypertension, hypothyroidism anemia and chronic kidney disease who was admitted last month for right foot infection underwent amputation and is on wound VAC and during that admission as per the reports patient had a couple of sessions dialysis and was eventually discharged to rehabilitation. Patient's routine lab work showed patient has hyperkalemia and was transferred to [facility].   ED Course: At [facility] lab work show patient has WBC of 9.3 hemoglobin of 9.3 platelets of 6:15. Creatinine of 6.2 glucose of 108 calcium 6.6 AST ALT within acceptable limits albumin 3.6 sodium 132 potassium 6.6. EKG was unremarkable chest x-ray was unremarkable. Patient was given Kayexalate and calcium gluconate and sodium bicarbonate D50 and insulin and transferred to [facility]. On my exam patient appears encephalopathic and is not oriented to name place or person. Unable to reach family.  Review of Systems: As per HPI, rest all negative.   Past Medical History:  Diagnosis Date  . Anemia   . Chronic kidney disease   . Hypertension     No past surgical history on file.   reports that she has never smoked. She has never used smokeless tobacco. Her alcohol and drug histories are not on file.  Not on File  Family History  Problem Relation Age of Onset  . Family history unknown: Yes    Prior to Admission medications   Not on File    Physical Exam: Vitals:   07/08/16 0023 07/08/16 0150 07/08/16 0224  BP: 136/71    Pulse: 98 96 97  Resp:  20 20   SpO2: 99% 98% 100%  Weight: (!) 201.4 kg (444 lb)        Constitutional: Obese not in distress. Vitals:   07/08/16 0023 07/08/16 0150 07/08/16 0224  BP: 136/71    Pulse: 98 96 97  Resp:  20 20  SpO2: 99% 98% 100%  Weight: (!) 201.4 kg (444 lb)     Eyes: Anicteric no pallor. ENMT: No discharge from the ears eyes nose and mouth. Neck: No mass palpable. No neck rigidity. Respiratory: No rhonchi or crepitations. Cardiovascular: S1 and S2 heard no murmurs appreciated. Abdomen: Soft nontender bowel sounds present. Musculoskeletal: Right foot dressing. The right groin wound VAC. Skin: Right foot dressing. Neurologic: Drowsy lethargic not oriented and does not follow commands. Psychiatric: Appears encephalopathic.   Labs on Admission: I have personally reviewed following labs and imaging studies  CBC:  Recent Labs Lab 07/08/16 0128  WBC 10.7*  NEUTROABS 8.3*  HGB 9.2*  HCT 30.6*  MCV 84.3  PLT 620*   Basic Metabolic Panel:  Recent Labs Lab 07/08/16 0128  NA 129*  K 6.9*  CL 92*  CO2 22  GLUCOSE 106*  BUN 87*  CREATININE 5.61*  CALCIUM 6.9*   GFR: CrCl cannot be calculated (Unknown ideal weight.). Liver Function Tests:  Recent Labs Lab 07/08/16 0128  AST 26  ALT 10*  ALKPHOS 84  BILITOT 0.4  PROT 7.5  ALBUMIN 2.4*   No results for input(s): LIPASE, AMYLASE in the last 168 hours. No results for input(s): AMMONIA in the last  168 hours. Coagulation Profile: No results for input(s): INR, PROTIME in the last 168 hours. Cardiac Enzymes: No results for input(s): CKTOTAL, CKMB, CKMBINDEX, TROPONINI in the last 168 hours. BNP (last 3 results) No results for input(s): PROBNP in the last 8760 hours. HbA1C: No results for input(s): HGBA1C in the last 72 hours. CBG: No results for input(s): GLUCAP in the last 168 hours. Lipid Profile: No results for input(s): CHOL, HDL, LDLCALC, TRIG, CHOLHDL, LDLDIRECT in the last 72 hours. Thyroid Function Tests: No  results for input(s): TSH, T4TOTAL, FREET4, T3FREE, THYROIDAB in the last 72 hours. Anemia Panel: No results for input(s): VITAMINB12, FOLATE, FERRITIN, TIBC, IRON, RETICCTPCT in the last 72 hours. Urine analysis: No results found for: COLORURINE, APPEARANCEUR, LABSPEC, PHURINE, GLUCOSEU, HGBUR, BILIRUBINUR, KETONESUR, PROTEINUR, UROBILINOGEN, NITRITE, LEUKOCYTESUR Sepsis Labs: @LABRCNTIP (procalcitonin:4,lacticidven:4) )No results found for this or any previous visit (from the past 240 hour(s)).   Radiological Exams on Admission: No results found.  EKG: Independently reviewed. EKG from panel shows normal sinus rhythm.  Assessment/Plan Principal Problem:   Acute encephalopathy Active Problems:   Acute on chronic renal failure (HCC)   Leg wound, right   Hypothyroidism   Hyperkalemia   Anemia due to end stage renal disease (HCC)    1. Acute encephalopathy - likely uremic and hypercarbic. At this time since patient's ABG shows hypercarbia I placed patient on BiPAP. I have consulted pulmonary critical care. Also consulted nephrologist. 2. Acute on chronic renal failure with hyperkalemia stage V - discussed with Dr. Signe Colt on call nephrologist soon be seeing patient in consultation and I have given Kayexalate rectally along with calcium gluconate D50 and insulin. Repeat metabolic panel. 3. Hypothyroidism IV Synthroid. 4. Right leg wound and sacral decubitus with wound VAC - wound team consult. 5. Anemia probably from chronic kidney disease - follow CBC. 6. History of diabetes mellitus type 2 - on sliding scale coverage.  Patient's home medications has to be verified. Not sure patient was on steroids. Try to reach family when available.  I have reviewed patient's charts from [facility].   DVT prophylaxis: Heparin. Code Status: Full code.  Family Communication: Try to reach patient's son but unable to.  Disposition Plan: To be determined.  Consults called: Nephrology and  pulmonary critical care.  Admission status: Inpatient.    Eduard Clos MD [facility] Pager [phone].  If 7PM-7AM, please contact night-coverage [email or web address] Password [facility]  07/08/2016, 2:57 AM

## 2016-07-08 NOTE — Progress Notes (Signed)
Diamond ContesEubanks, NP discussed POC with daughter present in room as well as son, [name], via phone.

## 2016-07-08 NOTE — Progress Notes (Signed)
CRITICAL VALUE ALERT  Critical Value:  Potassium 6.9  Date & Time Notied:  07/08/2016 @ 0225  Provider Notified: Toniann FailKakrakandy  Orders Received/Actions taken: MD notified. Awaiting orders.

## 2016-07-08 NOTE — Progress Notes (Signed)
Patients two sons arrived to bedside.  Consent for release of medical records obtained from [name] (son) and faxed to [facility] by secretary.  Family expressed wishes for pt not to go back to rehab facility she was at prior to admission.  Sam, case manager, notified of this and provided name of Rehab facility pt came from.  Case manager stated she would contact social work to follow up.  Both sons updated and aware of POC at this time.  Pt continues to rest in no visible distress with Bi-pap in place.  VSS.  Will continue to monitor.

## 2016-07-08 NOTE — Consult Note (Signed)
   Name: Diamond Parsons MRN: [id number] DOB: 12/28/1960    ADMISSION DATE:  07/07/2016 CONSULTATION DATE:  07/08/2016  REFERRING MD :  Dr. Randol KernElgergawy   CHIEF COMPLAINT:  Dyspnea   HISTORY OF PRESENT ILLNESS:   56 year old female with PMH of CKD, HTN, DM, Scral Decubitus Ulcer, and Anemia. Reported history of requiring HD. Last month admitted for right foot infection requiring amputation.   Presents to OSH on 6/21 from SNF with a K of 7.5. Upon arrival to ED patient was afebrile with oxygen saturation of 91% on RA, with no EKG changes. Patient was given insulin/glucose, Kayexalate, and bicarbonate and placed on 2L [place]. Transferred to [facility] for further work-up. Upon arrival to Hospital was encephalopathic with ABG of 7.23/58/55. Placed on BiPAP. CXR revealing of pulmonary vascular congestion without edema or consolidation. K 6.1, BUN 88, Creatinine 5.32. Nephrology consulted. PCCM consulted for respiratory distress.   SIGNIFICANT EVENTS  6/20 > Presents to OSH with Hyperkalemia   STUDIES:  CXR 6/21 > Evidence of pulmonary vascular congestion without edema or consolidation, air in upper esophagus  PAST MEDICAL HISTORY :   has a past medical history of Anemia; Chronic kidney disease; and Hypertension.  has no past surgical history on file. Prior to Admission medications   Not on File   Not on File  FAMILY HISTORY:  Family history is unknown by patient. SOCIAL HISTORY:  reports that she has never smoked. She has never used smokeless tobacco.  REVIEW OF SYSTEMS:   Unable to review as patient is encephalopathic    SUBJECTIVE:  On Bipap. Vitals stable.   VITAL SIGNS: Pulse Rate:  [96-98] 96 (06/21 0530) Resp:  [11-20] 11 (06/21 0530) BP: (98-136)/(55-105) 104/55 (06/21 0530) SpO2:  [98 %-100 %] 100 % (06/21 0530) FiO2 (%):  [40 %-50 %] 40 % (06/21 0342) Weight:  [201.4 kg (444 lb)] 201.4 kg (444 lb) (06/21 0023)  PHYSICAL EXAMINATION: General:  Adult female, no distress    Neuro:  Lethargic, confused, moves extremities  HEENT:  Bipap in place, normocephalic  Cardiovascular:  RRR, no MRG Lungs:  Crackles noted to bases, rhonchi throughout, no wheeze  Abdomen:  Obese, non-distended, active bowels sounds  Musculoskeletal: +1 edema  Skin:  Warm, dry, sacral decub and right foot amputation    Recent Labs Lab 07/08/16 0128  NA 129*  K 6.9*  CL 92*  CO2 22  BUN 87*  CREATININE 5.61*  GLUCOSE 106*    Recent Labs Lab 07/08/16 0128  HGB 9.2*  HCT 30.6*  WBC 10.7*  PLT 620*   No results found.  ASSESSMENT / PLAN:  Acute Hypoxic Respiratory Distress in setting of Acute on Chronic KD  Respiratory Acidosis  Plan -Continue BIPAP PRN -Maintain oxygenation > 92 -Pulmonary Hygiene  -Trend ABG/CXR as needed   Acute on Chronic Renal Failure  Hyperkalemia  Plan  -Consult Nephrology > HD scheduled, will placed HD cath and call family to get consent (not a long term HD candidate)  -Trend BMP > Obtain Repeat Potassium now   Metabolic vs Uremic Encephalopathy  -Ammonia 29, AST/ALT 26/10  Plan  -HD planned for today  -Monitor   -Remaining management per primary team   Jovita KussmaulKatalina Heron Pitcock, AGACNP-BC [facility]  Pgr: [phone]  PCCM Pgr: [phone]

## 2016-07-09 ENCOUNTER — Inpatient Hospital Stay (HOSPITAL_COMMUNITY): Payer: Medicare HMO

## 2016-07-09 DIAGNOSIS — R253 Fasciculation: Secondary | ICD-10-CM

## 2016-07-09 DIAGNOSIS — L8990 Pressure ulcer of unspecified site, unspecified stage: Secondary | ICD-10-CM | POA: Insufficient documentation

## 2016-07-09 LAB — BLOOD GAS, ARTERIAL
ACID-BASE DEFICIT: 1 mmol/L (ref 0.0–2.0)
BICARBONATE: 26.1 mmol/L (ref 20.0–28.0)
DELIVERY SYSTEMS: POSITIVE
Drawn by: 244871
Expiratory PAP: 8
FIO2: 40
INSPIRATORY PAP: 16
LHR: 12 {breaths}/min
MODE: POSITIVE
O2 Saturation: 98.1 %
PO2 ART: 121 mmHg — AB (ref 83.0–108.0)
Patient temperature: 98.6
pCO2 arterial: 69.2 mmHg (ref 32.0–48.0)
pH, Arterial: 7.202 — ABNORMAL LOW (ref 7.350–7.450)

## 2016-07-09 LAB — BASIC METABOLIC PANEL
ANION GAP: 14 (ref 5–15)
BUN: 61 mg/dL — ABNORMAL HIGH (ref 6–20)
CHLORIDE: 94 mmol/L — AB (ref 101–111)
CO2: 23 mmol/L (ref 22–32)
Calcium: 7 mg/dL — ABNORMAL LOW (ref 8.9–10.3)
Creatinine, Ser: 3.96 mg/dL — ABNORMAL HIGH (ref 0.44–1.00)
GFR calc Af Amer: 14 mL/min — ABNORMAL LOW (ref >60)
GFR, EST NON AFRICAN AMERICAN: 12 mL/min — AB (ref >60)
GLUCOSE: 97 mg/dL (ref 65–99)
POTASSIUM: 5.6 mmol/L — AB (ref 3.5–5.1)
Sodium: 131 mmol/L — ABNORMAL LOW (ref 135–145)

## 2016-07-09 LAB — CBC
HCT: 31.4 % — ABNORMAL LOW (ref 36.0–46.0)
HEMOGLOBIN: 9.2 g/dL — AB (ref 12.0–15.0)
MCH: 25.3 pg — ABNORMAL LOW (ref 26.0–34.0)
MCHC: 29.3 g/dL — ABNORMAL LOW (ref 30.0–36.0)
MCV: 86.3 fL (ref 78.0–100.0)
Platelets: 555 10*3/uL — ABNORMAL HIGH (ref 150–400)
RBC: 3.64 MIL/uL — ABNORMAL LOW (ref 3.87–5.11)
RDW: 22.8 % — AB (ref 11.5–15.5)
WBC: 9.1 10*3/uL (ref 4.0–10.5)

## 2016-07-09 LAB — GLUCOSE, CAPILLARY
GLUCOSE-CAPILLARY: 107 mg/dL — AB (ref 65–99)
GLUCOSE-CAPILLARY: 92 mg/dL (ref 65–99)
GLUCOSE-CAPILLARY: 92 mg/dL (ref 65–99)
GLUCOSE-CAPILLARY: 96 mg/dL (ref 65–99)
Glucose-Capillary: 85 mg/dL (ref 65–99)
Glucose-Capillary: 103 mg/dL — ABNORMAL HIGH (ref 65–99)
Glucose-Capillary: 89 mg/dL (ref 65–99)

## 2016-07-09 LAB — POTASSIUM
POTASSIUM: 5.6 mmol/L — AB (ref 3.5–5.1)
Potassium: 6.2 mmol/L — ABNORMAL HIGH (ref 3.5–5.1)
Potassium: 4.6 mmol/L (ref 3.5–5.1)

## 2016-07-09 LAB — PROCALCITONIN: PROCALCITONIN: 1.71 ng/mL

## 2016-07-09 MED ORDER — ORAL CARE MOUTH RINSE
15.0000 mL | Freq: Two times a day (BID) | OROMUCOSAL | Status: DC
  Administered 2016-07-09 – 2016-07-15 (×10): 15 mL via OROMUCOSAL

## 2016-07-09 MED ORDER — CHLORHEXIDINE GLUCONATE 0.12 % MT SOLN
15.0000 mL | Freq: Two times a day (BID) | OROMUCOSAL | Status: DC
Start: 2016-07-09 — End: 2016-07-15
  Administered 2016-07-09 – 2016-07-15 (×14): 15 mL via OROMUCOSAL
  Filled 2016-07-09 (×12): qty 15

## 2016-07-09 MED ORDER — MORPHINE SULFATE (PF) 4 MG/ML IV SOLN
2.0000 mg | Freq: Once | INTRAVENOUS | Status: AC
  Administered 2016-07-09: 2 mg via INTRAVENOUS
  Filled 2016-07-09: qty 1

## 2016-07-09 NOTE — Progress Notes (Signed)
[facility] Progress Note    Assessment/ Plan:   1.  AKI: Initially thought to be in the setting of HCTZ, lisinopril, Mobic--> but updated MAR from SNF shows she wasn't getting those meds?Marland Kitchen.  Unclear baseline.  will dialyze again today.  Not a long term dialysis candidate.  2.  Sacral decub: per primary  3.  Hyperkalemia: expect this to improve with dialysis  4.  Encephalopathy: multifactorial, per primary.  ? Occult sepsis?  Subjective:    Still on BiPaP, still encephalopathic.  Dialyzed yesterday for K and uremia.     Objective:   BP 105/70 (BP Location: Left Arm)   Pulse (!) 103   Temp 98 F (36.7 C) (Axillary)   Resp 13   Ht 5\' 4"  (1.626 m) Comment: from 06/25/16 encounter  Wt (!) 201 kg (443 lb 2 oz)   LMP  (LMP Unknown)   SpO2 100%   BMI 76.06 kg/m   Intake/Output Summary (Last 24 hours) at 07/09/16 1057 Last data filed at 07/09/16 0030  Gross per 24 hour  Intake                0 ml  Output              175 ml  Net             -175 ml   Weight change: -0.397 kg (-14 oz)  Physical Exam: GEN encephalopathic, opens eyes but can't carry on a conversation HEENT sclerae anicteric BiPaP in place NECK no JVD PULM bilateral anterior rhonchi CV RRR ABD obese EXT 1+ edema, R foot infection NEURO encephalopathic SKIN no rashes on exposed skin; I was unable to view sacral decub personally myself   Imaging: Ct Head Wo Contrast  Result Date: 07/09/2016 CLINICAL DATA:  Initial evaluation for acute altered mental status. EXAM: CT HEAD WITHOUT CONTRAST TECHNIQUE: Contiguous axial images were obtained from the base of the skull through the vertex without intravenous contrast. COMPARISON:  Prior CT from 06/03/2016. FINDINGS: Brain: Study limited by patient positioning and extensive motion artifact. Cerebral volume within normal limits. No obvious acute intracranial hemorrhage. Gray-white matter differentiation grossly maintained without definite evidence for  acute large vessel territory infarct. No mass lesion, midline shift or mass effect. No hydrocephalus. No appreciable extra-axial fluid collection on this severely motion degraded exam. Vascular: No hyperdense vessel. Skull: Scalp soft tissues within normal limits. Calvarium grossly intact. Sinuses/Orbits: Globes and orbital soft tissues within normal limits. Paranasal sinuses are clear. No mastoid effusion. IMPRESSION: The Severely motion degraded exam. No obvious acute intracranial process identified. Electronically Signed   By: Rise MuBenjamin  McClintock M.D.   On: 07/09/2016 04:54   Dg Chest Port 1 View  Result Date: 07/08/2016 CLINICAL DATA:  Encounter for central line placement. EXAM: PORTABLE CHEST 1 VIEW COMPARISON:  Radiograph of same day. FINDINGS: Stable cardiomegaly with central pulmonary vascular congestion. No pneumothorax or pleural effusion is noted. Interval placement of right internal jugular catheter with distal tip in expected position of SVC. Bony thorax is unremarkable. IMPRESSION: Stable cardiomegaly with central pulmonary vascular congestion. Interval placement of right internal jugular catheter with distal tip in expected position of SVC. Electronically Signed   By: Lupita RaiderJames  Green Jr, M.D.   On: 07/08/2016 13:16   Dg Chest Port 1 View  Result Date: 07/08/2016 CLINICAL DATA:  Hypoxia EXAM: PORTABLE CHEST 1 VIEW COMPARISON:  None. FINDINGS: There is no edema or consolidation. There is cardiomegaly with pulmonary venous hypertension. No adenopathy.  Moderate air is noted in the upper esophagus. Visualized trachea appears unremarkable. No bone lesions. IMPRESSION: Evidence pulmonary vascular congestion without edema or consolidation. Air in upper esophagus of uncertain etiology. Electronically Signed   By: Bretta Bang III M.D.   On: 07/08/2016 08:11    Labs: BMET  Recent Labs Lab 07/08/16 0128 07/08/16 0753 07/08/16 1102 07/09/16 0110 07/09/16 0748  NA 129* 131* 130* 131*  --    K 6.9* 6.1* 6.1* 5.6* 6.2*  CL 92* 93* 93* 94*  --   CO2 22 26 24 23   --   GLUCOSE 106* 98 96 97  --   BUN 87* 88* 90* 61*  --   CREATININE 5.61* 5.32* 5.37* 3.96*  --   CALCIUM 6.9* 6.9* 6.8* 7.0*  --   PHOS  --   --  10.0*  --   --    CBC  Recent Labs Lab 07/08/16 0128 07/08/16 0753 07/08/16 1102 07/09/16 0314  WBC 10.7* 10.0 9.3 9.1  NEUTROABS 8.3*  --   --   --   HGB 9.2* 9.2* 9.0* 9.2*  HCT 30.6* 30.4* 31.3* 31.4*  MCV 84.3 84.4 86.0 86.3  PLT 620* 604* 610* 555*    Medications:    . chlorhexidine  15 mL Mouth Rinse BID  . heparin  5,000 Units Subcutaneous Q8H  . insulin aspart  0-9 Units Subcutaneous Q4H  . levothyroxine  100 mcg Intravenous Daily  . mouth rinse  15 mL Mouth Rinse q12n4p      Bufford Buttner, MD [facility] pgr [phone] 07/09/2016, 10:57 AM

## 2016-07-09 NOTE — Progress Notes (Signed)
Pt taken off bipap and placed on 4L [place] post dialysis. Pt took [place] out of nose while RT in room. RT stressed importance of keeping Ruso in place. RN made aware. RT will continue to monitor.

## 2016-07-09 NOTE — Progress Notes (Signed)
Called regarding change in status. Pt admitted with encephalopathy, AoC renal failure, and hyperkalemia. HD catheter was placed earlier in the day and she had just completed HD when she was noted to have jerking movements and decreased responsiveness.   She is lethargic, on BiPAP with slight tachycardia and vitals otherwise wnl. Makes eye-contact to voice intermittently, squeezes hands on command after multiple requests and with some delay. Clonus noted involving left arm mainly, but other extremities as well.   Suspect hypercarbia vs metabolic etiology. ABG and chem panel being drawn now. Will likely get a head CT as well.

## 2016-07-09 NOTE — Progress Notes (Signed)
Patient is tolerating nasal cannula well at this time. Responding appropriately, no distress noted. HR 99 RR 16 and SPO2 100%. Patient agreed to go on BIPAP at bedtime. RT will continue to monitor and RT made pt aware that if she was wanting to go on BIPAP before I returned to call.

## 2016-07-09 NOTE — Progress Notes (Signed)
Name: Diamond Parsons MRN: [id number] DOB: Jan 10, 1961    ADMISSION DATE:  07/07/2016 CONSULTATION DATE:  07/08/2016  REFERRING MD :  Dr. Randol Kern   CHIEF COMPLAINT:  Dyspnea   HISTORY OF PRESENT ILLNESS:   56 year old female with PMH of CKD, HTN, DM, Scral Decubitus Ulcer, and Anemia. Reported history of requiring HD. Last month admitted for right foot infection requiring amputation.   Presents to OSH on 6/21 from SNF with a K of 7.5. Upon arrival to ED patient was afebrile with oxygen saturation of 91% on RA, with no EKG changes. Patient was given insulin/glucose, Kayexalate, and bicarbonate and placed on 2L [place]. Transferred to [facility] for further work-up. Upon arrival to Hospital was encephalopathic with ABG of 7.23/58/55. Placed on BiPAP. CXR revealing of pulmonary vascular congestion without edema or consolidation. K 6.1, BUN 88, Creatinine 5.32. Nephrology consulted. PCCM consulted for respiratory distress.   SIGNIFICANT EVENTS  6/20 > Presents to OSH with Hyperkalemia  6/21> HD cath placed, started on dialysis  STUDIES:  CXR 6/21 > Evidence of pulmonary vascular congestion without edema or consolidation, air in upper esophagus. I have reviewed the images personally  PAST MEDICAL HISTORY :   has a past medical history of Anemia; Chronic kidney disease; and Hypertension.  has no past surgical history on file. Prior to Admission medications   Not on File   Not on File  FAMILY HISTORY:  Family history is unknown by patient. SOCIAL HISTORY:  reports that she has never smoked. She has never used smokeless tobacco.  REVIEW OF SYSTEMS:   Unable to review as patient is encephalopathic    SUBJECTIVE:  Started on HD yesterday. Noted to have jerking movements and evaluated by neuro. CT head does not show any acute changes.   VITAL SIGNS: Temp:  [97.5 F (36.4 C)-98.4 F (36.9 C)] 98 F (36.7 C) (06/22 0810) Pulse Rate:  [57-107] 103 (06/22 0810) Resp:  [6-19] 13 (06/22  0810) BP: (85-149)/(61-128) 105/70 (06/22 0810) SpO2:  [95 %-100 %] 100 % (06/22 0810) FiO2 (%):  [40 %] 40 % (06/21 1439) Weight:  [443 lb 2 oz (201 kg)] 443 lb 2 oz (201 kg) (06/22 0030)  PHYSICAL EXAMINATION: Gen:      No acute distress HEENT:  EOMI, sclera anicteric Neck:     No masses; no thyromegaly Lungs:    Mild crackles; normal respiratory effort CV:         Regular rate and rhythm; no murmurs Abd:      + bowel sounds; soft, non-tender; no palpable masses, no distension Ext:    1+ edema; rt foot amputation, adequate peripheral perfusion Skin:      Warm and dry; no rash Neuro: Awake, nods to commands.    Recent Labs Lab 07/08/16 0753 07/08/16 1102 07/09/16 0110  NA 131* 130* 131*  K 6.1* 6.1* 5.6*  CL 93* 93* 94*  CO2 26 24 23   BUN 88* 90* 61*  CREATININE 5.32* 5.37* 3.96*  GLUCOSE 98 96 97    Recent Labs Lab 07/08/16 0753 07/08/16 1102 07/09/16 0314  HGB 9.2* 9.0* 9.2*  HCT 30.4* 31.3* 31.4*  WBC 10.0 9.3 9.1  PLT 604* 610* 555*   Ct Head Wo Contrast  Result Date: 07/09/2016 CLINICAL DATA:  Initial evaluation for acute altered mental status. EXAM: CT HEAD WITHOUT CONTRAST TECHNIQUE: Contiguous axial images were obtained from the base of the skull through the vertex without intravenous contrast. COMPARISON:  Prior CT from 06/03/2016.  FINDINGS: Brain: Study limited by patient positioning and extensive motion artifact. Cerebral volume within normal limits. No obvious acute intracranial hemorrhage. Gray-white matter differentiation grossly maintained without definite evidence for acute large vessel territory infarct. No mass lesion, midline shift or mass effect. No hydrocephalus. No appreciable extra-axial fluid collection on this severely motion degraded exam. Vascular: No hyperdense vessel. Skull: Scalp soft tissues within normal limits. Calvarium grossly intact. Sinuses/Orbits: Globes and orbital soft tissues within normal limits. Paranasal sinuses are clear. No  mastoid effusion. IMPRESSION: The Severely motion degraded exam. No obvious acute intracranial process identified. Electronically Signed   By: Rise MuBenjamin  McClintock M.D.   On: 07/09/2016 04:54   Dg Chest Port 1 View  Result Date: 07/08/2016 CLINICAL DATA:  Encounter for central line placement. EXAM: PORTABLE CHEST 1 VIEW COMPARISON:  Radiograph of same day. FINDINGS: Stable cardiomegaly with central pulmonary vascular congestion. No pneumothorax or pleural effusion is noted. Interval placement of right internal jugular catheter with distal tip in expected position of SVC. Bony thorax is unremarkable. IMPRESSION: Stable cardiomegaly with central pulmonary vascular congestion. Interval placement of right internal jugular catheter with distal tip in expected position of SVC. Electronically Signed   By: Lupita RaiderJames  Green Jr, M.D.   On: 07/08/2016 13:16   Dg Chest Port 1 View  Result Date: 07/08/2016 CLINICAL DATA:  Hypoxia EXAM: PORTABLE CHEST 1 VIEW COMPARISON:  None. FINDINGS: There is no edema or consolidation. There is cardiomegaly with pulmonary venous hypertension. No adenopathy. Moderate air is noted in the upper esophagus. Visualized trachea appears unremarkable. No bone lesions. IMPRESSION: Evidence pulmonary vascular congestion without edema or consolidation. Air in upper esophagus of uncertain etiology. Electronically Signed   By: Bretta BangWilliam  Woodruff III M.D.   On: 07/08/2016 08:11    ASSESSMENT / PLAN:  56 year old with chronic kidney disease, morbid obesity, acute on chronic kidney disease admitted with uremia, hyperkalemia, altered mental status and respiratory failure  Acute Hypoxic Respiratory Distress in setting of Acute on Chronic KD  Respiratory Acidosis  Plan -Continue BIPAP PRN -Follow ABG and CXR -Continue close monitoring. She is at risk for needing intubation.   Acute on Chronic Renal Failure  Hyperkalemia  Plan  -Started on HD - Management per nephrology  Encephalopathy.  Likely from metabolic causes with uremia, hypercarbia, hypothyroidism -Ammonia 29, AST/ALT 26/10  Plan  - Monitor neuro status  Hypothyroidism On IV synthroid,  Follow repeat TSH. Check free T4  The patient is critically ill with multiple organ system failure and requires high complexity decision making for assessment and support, frequent evaluation and titration of therapies, advanced monitoring, review of radiographic studies and interpretation of complex data.   Critical Care Time devoted to patient care services, exclusive of separately billable procedures, described in this note is 35 minutes.   Chilton GreathousePraveen Nieko Clarin MD [facility] Pager [phone] If no answer or after 3pm call: [phone] 07/09/2016, 8:16 AM

## 2016-07-09 NOTE — Progress Notes (Addendum)
Pt just completed dialysis in room, now noted with body jerks. Lethargic but does respond to verbal/tactile stimulation and follows simple commands. Was able to squeeze hands and wiggle toes on lt foot. Dr. Antionette Charpyd notified, awaiting response.   [id number]- Dr. Antionette Charpyd in room to see pt, will order head CT

## 2016-07-09 NOTE — Progress Notes (Addendum)
PROGRESS NOTE                                                                                                                                                                                                             Patient Demographics:    Diamond Parsons, is a 56 y.o. female, DOB - 02/17/1960, [id number]  Admit date - 07/07/2016   Admitting Physician Eduard ClosArshad N Kakrakandy, MD  Outpatient Primary MD for the patient is Patient, No Pcp Per  LOS - 2   No chief complaint on file.      Brief Narrative   56 year old female with past medical history of diabetes mellitus, hypertension, hypothyroidism, anemia, chronic kidney disease, with recent prolonged hospital stay at [facility] from 5/16-  6/12, that hospital stay was noted for retired tests and sepsis secondary to cholecystitis status post laparoscopic cholecystectomy, acute arterial ischemia/gangrene of the right foot status post thrombectomy, and transtibial amputation of right foot, AKI on CKD stage III, where she required temporary hemodialysis, she was off hemodialysis on discharge. Patient was admitted for lethargy and abnormal labs at skilled nursing facility, she was started on hemodialysis for hyperkalemia, and history of present illness CKD, as well she was noted to have respiratory failure with hypercapnia where she is requiring continuous BiPAP.   Subjective:    Diamond PizzaMonica Angelo today Is confused and lethargic, cannot provide any complaints .   Assessment  & Plan :    Principal Problem:   Acute encephalopathy Active Problems:   Acute on chronic renal failure (HCC)   Leg wound, right   Hypothyroidism   Hyperkalemia   Anemia due to end stage renal disease (HCC)   Pressure injury of skin   Acute hypoxic/hypercapnic respiratory failure - Patient still requiring continuous BiPAP, PCCM following. 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 - She is  high risk for intubation  AKI on CK D stage III  - Baseline creatinine around 1.2, new creatinine baseline was 1.8 upon discharge from [facility], she presents with a creatinine of right 0.6 on admission, and hyperkalemia with potassium of 6.9. - Patient was started on hemodialysis per renal, not a long-term dialysis candidate.  Hyperkalemia - Currently on dialysis  Multiple skin wound/Sacral decubitus ulcer - Continue with wound care, wound care consulted.  status  post right foot metatarsal amputation - Secondary to acute arterial ischemia and gangrene of the right foot, status post thrombectomy at [facility]. - Patient to follow with orthopedic Dr. Samuel Bouche who performed initial surgery after discharge.  Acute Encephalopathy - Appears to be multifactorial, secondary to hypercapnia and uremia Metabolic versus uremic - Patient remains encephalopathic, unable to straighten oral intake.  Hypothyroidism - Continue Synthroid  Diabetes mellitus - Continue with aspirin sliding scale  Hypertension - blood Pressure is acceptable off medication  OSA - At baseline she is on C Pap at night, currently she is on BiPAP  Code Status : Full  Family Communication  : Discussed with son via phone 6/21  Disposition Plan  : Back to SNF once stable   Consults  : Renal , PCCM  Procedures  : Temporary dialysis catheter insertion  DVT Prophylaxis  :   Heparin   Lab Results  Component Value Date   PLT 555 (H) 07/09/2016    Antibiotics  :    Anti-infectives    None        Objective:   Vitals:   07/09/16 1552 07/09/16 1600 07/09/16 1630 07/09/16 1645  BP: 99/67 112/69 110/66 105/67  Pulse: (!) 101 (!) 104 (!) 104 (!) 103  Resp: 17 13 14 12   Temp: 97.2 F (36.2 C)   97.1 F (36.2 C)  TempSrc: Axillary   Axillary  SpO2: 100% 100% 100% 100%  Weight:    (!) 203.4 kg (448 lb 6.7 oz)  Height:        Wt Readings from Last 3 Encounters:  07/09/16 (!) 203.4 kg (448  lb 6.7 oz)     Intake/Output Summary (Last 24 hours) at 07/09/16 1712 Last data filed at 07/09/16 1645  Gross per 24 hour  Intake                0 ml  Output             1175 ml  Net            -1175 ml     Physical Exam  Targeted, open eyes to verbal stimuli, otherwise confused, she remains on BiPAP  Distant breathing sounds secondary to body habitus, some crackles at the bases, no use of accessory muscles Regular rate and rhythm, no rubs murmurs gallops, Abdomen morbidly obese, soft, nontender, no rebound or guarding  +1 edema left lower extremity, right foot with transmetatarsal amputation, with staples, no evidence of dehiscence or discharge .   Data Review:    CBC  Recent Labs Lab 07/08/16 0128 07/08/16 0753 07/08/16 1102 07/09/16 0314  WBC 10.7* 10.0 9.3 9.1  HGB 9.2* 9.2* 9.0* 9.2*  HCT 30.6* 30.4* 31.3* 31.4*  PLT 620* 604* 610* 555*  MCV 84.3 84.4 86.0 86.3  MCH 25.3* 25.6* 24.7* 25.3*  MCHC 30.1 30.3 28.8* 29.3*  RDW 22.3* 22.4* 22.7* 22.8*  LYMPHSABS 1.6  --   --   --   MONOABS 0.6  --   --   --   EOSABS 0.1  --   --   --   BASOSABS 0.1  --   --   --     Chemistries   Recent Labs Lab 07/08/16 0128 07/08/16 0753 07/08/16 1102 07/09/16 0110 07/09/16 0748 07/09/16 1210  NA 129* 131* 130* 131*  --   --   K 6.9* 6.1* 6.1* 5.6* 6.2* 5.6*  CL 92* 93* 93* 94*  --   --  CO2 22 26 24 23   --   --   GLUCOSE 106* 98 96 97  --   --   BUN 87* 88* 90* 61*  --   --   CREATININE 5.61* 5.32* 5.37* 3.96*  --   --   CALCIUM 6.9* 6.9* 6.8* 7.0*  --   --   AST 26  --   --   --   --   --   ALT 10*  --   --   --   --   --   ALKPHOS 84  --   --   --   --   --   BILITOT 0.4  --   --   --   --   --    ------------------------------------------------------------------------------------------------------------------ No results for input(s): CHOL, HDL, LDLCALC, TRIG, CHOLHDL, LDLDIRECT in the last 72 hours.  No results found for:  HGBA1C ------------------------------------------------------------------------------------------------------------------  Recent Labs  07/08/16 0753  TSH 13.595*   ------------------------------------------------------------------------------------------------------------------ No results for input(s): VITAMINB12, FOLATE, FERRITIN, TIBC, IRON, RETICCTPCT in the last 72 hours.  Coagulation profile No results for input(s): INR, PROTIME in the last 168 hours.  No results for input(s): DDIMER in the last 72 hours.  Cardiac Enzymes No results for input(s): CKMB, TROPONINI, MYOGLOBIN in the last 168 hours.  Invalid input(s): CK ------------------------------------------------------------------------------------------------------------------    Component Value Date/Time   BNP 76.1 07/08/2016 0753    Inpatient Medications  Scheduled Meds: . chlorhexidine  15 mL Mouth Rinse BID  . heparin  5,000 Units Subcutaneous Q8H  . insulin aspart  0-9 Units Subcutaneous Q4H  . levothyroxine  100 mcg Intravenous Daily  . mouth rinse  15 mL Mouth Rinse q12n4p   Continuous Infusions: . sodium chloride    . sodium chloride     PRN Meds:.sodium chloride, sodium chloride, acetaminophen **OR** acetaminophen, alteplase, heparin, lidocaine (PF), lidocaine-prilocaine, ondansetron **OR** ondansetron (ZOFRAN) IV, pentafluoroprop-tetrafluoroeth  Micro Results Recent Results (from the past 240 hour(s))  MRSA PCR Screening     Status: None   Collection Time: 07/08/16  4:18 AM  Result Value Ref Range Status   MRSA by PCR NEGATIVE NEGATIVE Final    Comment:        The GeneXpert MRSA Assay (FDA approved for NASAL specimens only), is one component of a comprehensive MRSA colonization surveillance program. It is not intended to diagnose MRSA infection nor to guide or monitor treatment for MRSA infections.     Radiology Reports Ct Head Wo Contrast  Result Date: 07/09/2016 CLINICAL DATA:   Initial evaluation for acute altered mental status. EXAM: CT HEAD WITHOUT CONTRAST TECHNIQUE: Contiguous axial images were obtained from the base of the skull through the vertex without intravenous contrast. COMPARISON:  Prior CT from 06/03/2016. FINDINGS: Brain: Study limited by patient positioning and extensive motion artifact. Cerebral volume within normal limits. No obvious acute intracranial hemorrhage. Gray-white matter differentiation grossly maintained without definite evidence for acute large vessel territory infarct. No mass lesion, midline shift or mass effect. No hydrocephalus. No appreciable extra-axial fluid collection on this severely motion degraded exam. Vascular: No hyperdense vessel. Skull: Scalp soft tissues within normal limits. Calvarium grossly intact. Sinuses/Orbits: Globes and orbital soft tissues within normal limits. Paranasal sinuses are clear. No mastoid effusion. IMPRESSION: The Severely motion degraded exam. No obvious acute intracranial process identified. Electronically Signed   By: Rise Mu M.D.   On: 07/09/2016 04:54   Dg Chest Port 1 View  Result Date: 07/08/2016 CLINICAL DATA:  Encounter for  central line placement. EXAM: PORTABLE CHEST 1 VIEW COMPARISON:  Radiograph of same day. FINDINGS: Stable cardiomegaly with central pulmonary vascular congestion. No pneumothorax or pleural effusion is noted. Interval placement of right internal jugular catheter with distal tip in expected position of SVC. Bony thorax is unremarkable. IMPRESSION: Stable cardiomegaly with central pulmonary vascular congestion. Interval placement of right internal jugular catheter with distal tip in expected position of SVC. Electronically Signed   By: Lupita Raider, M.D.   On: 07/08/2016 13:16   Dg Chest Port 1 View  Result Date: 07/08/2016 CLINICAL DATA:  Hypoxia EXAM: PORTABLE CHEST 1 VIEW COMPARISON:  None. FINDINGS: There is no edema or consolidation. There is cardiomegaly with  pulmonary venous hypertension. No adenopathy. Moderate air is noted in the upper esophagus. Visualized trachea appears unremarkable. No bone lesions. IMPRESSION: Evidence pulmonary vascular congestion without edema or consolidation. Air in upper esophagus of uncertain etiology. Electronically Signed   By: Bretta Bang III M.D.   On: 07/08/2016 08:11     Semiyah Newgent M.D on 07/09/2016 at 5:12 PM  Between 7am to 7pm - Pager - [phone]  After 7pm go to [email or web address] - password [facility]  [facility] -  Office  [phone]

## 2016-07-09 NOTE — Procedures (Signed)
Patient seen and examined on Hemodialysis. QB 200, UF goal 500 mL.  Pt a little more alert than this AM  Treatment adjusted as needed.  Bufford ButtnerElizabeth Valentino Saavedra MD  [facility] pgr [phone] 4:17 PM

## 2016-07-10 ENCOUNTER — Inpatient Hospital Stay (HOSPITAL_COMMUNITY): Payer: Medicare HMO

## 2016-07-10 DIAGNOSIS — N189 Chronic kidney disease, unspecified: Secondary | ICD-10-CM

## 2016-07-10 DIAGNOSIS — J9600 Acute respiratory failure, unspecified whether with hypoxia or hypercapnia: Secondary | ICD-10-CM

## 2016-07-10 DIAGNOSIS — I824Y3 Acute embolism and thrombosis of unspecified deep veins of proximal lower extremity, bilateral: Secondary | ICD-10-CM

## 2016-07-10 DIAGNOSIS — R4182 Altered mental status, unspecified: Secondary | ICD-10-CM

## 2016-07-10 DIAGNOSIS — J9601 Acute respiratory failure with hypoxia: Secondary | ICD-10-CM

## 2016-07-10 DIAGNOSIS — N179 Acute kidney failure, unspecified: Secondary | ICD-10-CM

## 2016-07-10 DIAGNOSIS — N186 End stage renal disease: Secondary | ICD-10-CM

## 2016-07-10 DIAGNOSIS — J9602 Acute respiratory failure with hypercapnia: Secondary | ICD-10-CM

## 2016-07-10 DIAGNOSIS — D631 Anemia in chronic kidney disease: Secondary | ICD-10-CM

## 2016-07-10 LAB — BASIC METABOLIC PANEL
ANION GAP: 11 (ref 5–15)
BUN: 46 mg/dL — ABNORMAL HIGH (ref 6–20)
CHLORIDE: 98 mmol/L — AB (ref 101–111)
CO2: 26 mmol/L (ref 22–32)
Calcium: 6.7 mg/dL — ABNORMAL LOW (ref 8.9–10.3)
Creatinine, Ser: 2.34 mg/dL — ABNORMAL HIGH (ref 0.44–1.00)
GFR calc non Af Amer: 22 mL/min — ABNORMAL LOW (ref >60)
GFR, EST AFRICAN AMERICAN: 26 mL/min — AB (ref >60)
Glucose, Bld: 79 mg/dL (ref 65–99)
Potassium: 4.5 mmol/L (ref 3.5–5.1)
SODIUM: 135 mmol/L (ref 135–145)

## 2016-07-10 LAB — C DIFFICILE QUICK SCREEN W PCR REFLEX
C DIFFICILE (CDIFF) TOXIN: POSITIVE — AB
C DIFFICLE (CDIFF) ANTIGEN: POSITIVE — AB
C Diff interpretation: DETECTED

## 2016-07-10 LAB — CBC
HEMATOCRIT: 30.4 % — AB (ref 36.0–46.0)
HEMOGLOBIN: 8.8 g/dL — AB (ref 12.0–15.0)
MCH: 24.9 pg — ABNORMAL LOW (ref 26.0–34.0)
MCHC: 28.9 g/dL — ABNORMAL LOW (ref 30.0–36.0)
MCV: 85.9 fL (ref 78.0–100.0)
Platelets: 470 10*3/uL — ABNORMAL HIGH (ref 150–400)
RBC: 3.54 MIL/uL — AB (ref 3.87–5.11)
RDW: 22.7 % — ABNORMAL HIGH (ref 11.5–15.5)
WBC: 10.7 10*3/uL — AB (ref 4.0–10.5)

## 2016-07-10 LAB — GLUCOSE, CAPILLARY
GLUCOSE-CAPILLARY: 76 mg/dL (ref 65–99)
GLUCOSE-CAPILLARY: 78 mg/dL (ref 65–99)
Glucose-Capillary: 72 mg/dL (ref 65–99)
Glucose-Capillary: 75 mg/dL (ref 65–99)
Glucose-Capillary: 83 mg/dL (ref 65–99)
Glucose-Capillary: 85 mg/dL (ref 65–99)

## 2016-07-10 LAB — HEPATITIS B CORE ANTIBODY, TOTAL: HEP B C TOTAL AB: NEGATIVE

## 2016-07-10 LAB — PROCALCITONIN: PROCALCITONIN: 1.26 ng/mL

## 2016-07-10 LAB — HEPATITIS B SURFACE ANTIGEN: HEP B S AG: NEGATIVE

## 2016-07-10 LAB — POTASSIUM: Potassium: 5.8 mmol/L — ABNORMAL HIGH (ref 3.5–5.1)

## 2016-07-10 LAB — HEPATITIS B SURFACE ANTIBODY,QUALITATIVE: HEP B S AB: NONREACTIVE

## 2016-07-10 LAB — LIPASE, BLOOD: LIPASE: 67 U/L — AB (ref 11–51)

## 2016-07-10 LAB — T4, FREE: FREE T4: 0.49 ng/dL — AB (ref 0.61–1.12)

## 2016-07-10 MED ORDER — VANCOMYCIN 50 MG/ML ORAL SOLUTION
125.0000 mg | Freq: Four times a day (QID) | ORAL | Status: DC
  Administered 2016-07-10 – 2016-07-15 (×19): 125 mg via ORAL
  Filled 2016-07-10 (×23): qty 2.5

## 2016-07-10 MED ORDER — OXYCODONE HCL 5 MG PO TABS
5.0000 mg | ORAL_TABLET | Freq: Four times a day (QID) | ORAL | Status: DC | PRN
  Administered 2016-07-10: 5 mg via ORAL
  Filled 2016-07-10: qty 1

## 2016-07-10 MED ORDER — MORPHINE SULFATE (PF) 4 MG/ML IV SOLN
2.0000 mg | Freq: Once | INTRAVENOUS | Status: AC
  Administered 2016-07-10: 2 mg via INTRAVENOUS
  Filled 2016-07-10: qty 1

## 2016-07-10 NOTE — Progress Notes (Signed)
Pt states she doesn't know if she wants to wear Bipap tonight. I told her to call if she decides to wear it.

## 2016-07-10 NOTE — Progress Notes (Signed)
PROGRESS NOTE                                                                                                                                                                                                             Patient Demographics:    Diamond Parsons, is a 56 y.o. female, DOB - 02-11-1960, [id number]  Admit date - 07/07/2016   Admitting Physician Eduard Clos, MD  Outpatient Primary MD for the patient is Patient, No Pcp Per  LOS - 3   No chief complaint on file.      Brief Narrative   56 year old female with past medical history of diabetes mellitus, hypertension, hypothyroidism, anemia, chronic kidney disease, with recent prolonged hospital stay at [facility] from 5/16-  6/12, that hospital stay was noted for retired tests and sepsis secondary to cholecystitis status post laparoscopic cholecystectomy, acute arterial ischemia/gangrene of the right foot status post thrombectomy, and transtibial amputation of right foot, AKI on CKD stage III, where she required temporary hemodialysis, she was off hemodialysis on discharge. Patient was admitted for lethargy and abnormal labs at skilled nursing facility, she was started on hemodialysis for hyperkalemia, and history of present illness CKD, as well she was noted to have respiratory failure with hypercapnia where she is requiring continuous BiPAP.   Subjective:    Kerrianne Jeng today Is more awake and appropriate, but still slightly confused, complains of some right lower extremity pain, denies any chest pain or shortness of breath.   Assessment  & Plan :    Principal Problem:   Acute encephalopathy Active Problems:   Acute on chronic renal failure (HCC)   Leg wound, right   Hypothyroidism   Hyperkalemia   Anemia due to end stage renal disease (HCC)   Pressure injury of skin   Acute respiratory failure with hypoxia and hypercapnia (HCC)   Acute respiratory failure  (HCC)   Acute hypoxic/hypercapnic respiratory failure - She is requiring continuous BiPAP in the next day, PC CL following , appears to be improving, has been off BiPAP over the last few hours, patient appears to be improving as well, will continue to monitor closely, keep on BiPAP when necessary . 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  AKI on CK D stage III  - Baseline creatinine around 1.2,  new creatinine baseline was 1.8 upon discharge from [facility], she presents with a creatinine of right 5.6 on admission, and hyperkalemia with potassium of 6.9. - Patient was started on hemodialysis per renal, not a long-term dialysis candidate, she has temporary hemodialysis catheter inserted by critical care, dialysis decision based on day-to-day basis, she made 1 L urine over last 24 hours, potassium appears to be stable, no plans for dialysis today per renal  Hyperkalemia - Currently on dialysis  Multiple skin wound/Sacral decubitus ulcer - Continue with wound care, wound care consulted.  status  post right foot metatarsal amputation - Secondary to acute arterial ischemia and gangrene of the right foot, status post thrombectomy at [facility]. - Vascular surgery to evaluate today and look at the wound regarding further recommendation, wound VAC right groin area has been discontinued on admission.  Acute Encephalopathy - Appears to be multifactorial, secondary to hypercapnia and uremia Metabolic versus uremic - Appears to be improving More coherent today , will have his OP to evaluate if appropriate to start on diet .  Hypothyroidism - Continue Synthroid, she has elevated TSH, and low free T4, so will increase her Synthroid dose  Diabetes mellitus - Continue with aspirin sliding scale  Hypertension - blood Pressure is acceptable off medication  OSA - Currently on BiPAP when necessary, meanwhile will continue with C Pap at bedtime    Patient appears to be on Eliquis was recently discharged from [facility], there is no evidence of A. fib, PE or DVT, unclear why she is needing it, only reason it would be secondary to acute limb ischemia she had at [facility], question vascular surgery input if these need for anticoagulation versus aspirin.  Code Status : Full  Family Communication  : Discussed with both sons and husband at bedside  Disposition Plan  : Back to SNF once stable   Consults  : Renal , PCCM, vascular surgery  Procedures  : Temporary dialysis catheter insertion  DVT Prophylaxis  :   Heparin   Lab Results  Component Value Date   PLT 470 (H) 07/10/2016    Antibiotics  :    Anti-infectives    None        Objective:   Vitals:   07/10/16 0400 07/10/16 0443 07/10/16 0500 07/10/16 0600  BP: 131/71  120/69 121/65  Pulse: 89   94  Resp: 13  (!) 7 15  Temp:  97.9 F (36.6 C)    TempSrc:  Axillary    SpO2: 100%   98%  Weight:      Height:        Wt Readings from Last 3 Encounters:  07/09/16 (!) 203.4 kg (448 lb 6.7 oz)     Intake/Output Summary (Last 24 hours) at 07/10/16 1148 Last data filed at 07/10/16 1108  Gross per 24 hour  Intake                0 ml  Output             2700 ml  Net            -2700 ml     Physical Exam  More awake and appropriate today, answers simple questions and following simple commands  Good air entry bilaterally, but distant breathing sounds, no crackles or wheezing  Regular rate and rhythm, no rubs, gallops  Abdomen soft, obese, nontender, nondistended, bowel sounds present  Trace edema and left lower extremity, right  foot with transmetatarsal amputation, staples intact, no dehiscence or discharge, please see above pic.  .   Data Review:    CBC  Recent Labs Lab 07/08/16 0128 07/08/16 0753 07/08/16 1102 07/09/16 0314 07/10/16 0456  WBC 10.7* 10.0 9.3 9.1 10.7*  HGB 9.2* 9.2* 9.0* 9.2* 8.8*  HCT 30.6* 30.4* 31.3* 31.4*  30.4*  PLT 620* 604* 610* 555* 470*  MCV 84.3 84.4 86.0 86.3 85.9  MCH 25.3* 25.6* 24.7* 25.3* 24.9*  MCHC 30.1 30.3 28.8* 29.3* 28.9*  RDW 22.3* 22.4* 22.7* 22.8* 22.7*  LYMPHSABS 1.6  --   --   --   --   MONOABS 0.6  --   --   --   --   EOSABS 0.1  --   --   --   --   BASOSABS 0.1  --   --   --   --     Chemistries   Recent Labs Lab 07/08/16 0128 07/08/16 0753 07/08/16 1102 07/09/16 0110 07/09/16 0748 07/09/16 1210 07/09/16 1840 07/10/16 0030 07/10/16 0456  NA 129* 131* 130* 131*  --   --   --   --  135  K 6.9* 6.1* 6.1* 5.6* 6.2* 5.6* 4.6 5.8* 4.5  CL 92* 93* 93* 94*  --   --   --   --  98*  CO2 22 26 24 23   --   --   --   --  26  GLUCOSE 106* 98 96 97  --   --   --   --  79  BUN 87* 88* 90* 61*  --   --   --   --  46*  CREATININE 5.61* 5.32* 5.37* 3.96*  --   --   --   --  2.34*  CALCIUM 6.9* 6.9* 6.8* 7.0*  --   --   --   --  6.7*  AST 26  --   --   --   --   --   --   --   --   ALT 10*  --   --   --   --   --   --   --   --   ALKPHOS 84  --   --   --   --   --   --   --   --   BILITOT 0.4  --   --   --   --   --   --   --   --    ------------------------------------------------------------------------------------------------------------------ No results for input(s): CHOL, HDL, LDLCALC, TRIG, CHOLHDL, LDLDIRECT in the last 72 hours.  No results found for: HGBA1C ------------------------------------------------------------------------------------------------------------------  Recent Labs  07/08/16 0753  TSH 13.595*   ------------------------------------------------------------------------------------------------------------------ No results for input(s): VITAMINB12, FOLATE, FERRITIN, TIBC, IRON, RETICCTPCT in the last 72 hours.  Coagulation profile No results for input(s): INR, PROTIME in the last 168 hours.  No results for input(s): DDIMER in the last 72 hours.  Cardiac Enzymes No results for input(s): CKMB, TROPONINI, MYOGLOBIN in the last 168  hours.  Invalid input(s): CK ------------------------------------------------------------------------------------------------------------------    Component Value Date/Time   BNP 76.1 07/08/2016 0753    Inpatient Medications  Scheduled Meds: . chlorhexidine  15 mL Mouth Rinse BID  . heparin  5,000 Units Subcutaneous Q8H  . insulin aspart  0-9 Units Subcutaneous Q4H  . levothyroxine  100 mcg Intravenous Daily  . mouth rinse  15 mL Mouth Rinse q12n4p   Continuous Infusions: .  sodium chloride    . sodium chloride     PRN Meds:.sodium chloride, sodium chloride, acetaminophen **OR** acetaminophen, alteplase, heparin, lidocaine (PF), lidocaine-prilocaine, ondansetron **OR** ondansetron (ZOFRAN) IV, pentafluoroprop-tetrafluoroeth  Micro Results Recent Results (from the past 240 hour(s))  MRSA PCR Screening     Status: None   Collection Time: 07/08/16  4:18 AM  Result Value Ref Range Status   MRSA by PCR NEGATIVE NEGATIVE Final    Comment:        The GeneXpert MRSA Assay (FDA approved for NASAL specimens only), is one component of a comprehensive MRSA colonization surveillance program. It is not intended to diagnose MRSA infection nor to guide or monitor treatment for MRSA infections.     Radiology Reports Ct Head Wo Contrast  Result Date: 07/09/2016 CLINICAL DATA:  Initial evaluation for acute altered mental status. EXAM: CT HEAD WITHOUT CONTRAST TECHNIQUE: Contiguous axial images were obtained from the base of the skull through the vertex without intravenous contrast. COMPARISON:  Prior CT from 06/03/2016. FINDINGS: Brain: Study limited by patient positioning and extensive motion artifact. Cerebral volume within normal limits. No obvious acute intracranial hemorrhage. Gray-white matter differentiation grossly maintained without definite evidence for acute large vessel territory infarct. No mass lesion, midline shift or mass effect. No hydrocephalus. No appreciable extra-axial  fluid collection on this severely motion degraded exam. Vascular: No hyperdense vessel. Skull: Scalp soft tissues within normal limits. Calvarium grossly intact. Sinuses/Orbits: Globes and orbital soft tissues within normal limits. Paranasal sinuses are clear. No mastoid effusion. IMPRESSION: The Severely motion degraded exam. No obvious acute intracranial process identified. Electronically Signed   By: Rise MuBenjamin  McClintock M.D.   On: 07/09/2016 04:54   Dg Chest Port 1 View  Result Date: 07/10/2016 CLINICAL DATA:  Respiratory failure as well as chronic renal failure. EXAM: PORTABLE CHEST 1 VIEW COMPARISON:  07/08/2016 FINDINGS: Right IJ central venous catheter unchanged with tip over the SVC. Lungs are somewhat hypoinflated with mild stable prominence of the perihilar markings likely mild vascular congestion. No lobar consolidation or effusion. Stable cardiomegaly. Remainder of the exam is unchanged. IMPRESSION: Stable cardiomegaly with mild stable vascular congestion. Right IJ central venous catheter unchanged. Electronically Signed   By: Elberta Fortisaniel  Boyle M.D.   On: 07/10/2016 08:22   Dg Chest Port 1 View  Result Date: 07/08/2016 CLINICAL DATA:  Encounter for central line placement. EXAM: PORTABLE CHEST 1 VIEW COMPARISON:  Radiograph of same day. FINDINGS: Stable cardiomegaly with central pulmonary vascular congestion. No pneumothorax or pleural effusion is noted. Interval placement of right internal jugular catheter with distal tip in expected position of SVC. Bony thorax is unremarkable. IMPRESSION: Stable cardiomegaly with central pulmonary vascular congestion. Interval placement of right internal jugular catheter with distal tip in expected position of SVC. Electronically Signed   By: Lupita RaiderJames  Green Jr, M.D.   On: 07/08/2016 13:16   Dg Chest Port 1 View  Result Date: 07/08/2016 CLINICAL DATA:  Hypoxia EXAM: PORTABLE CHEST 1 VIEW COMPARISON:  None. FINDINGS: There is no edema or consolidation. There is  cardiomegaly with pulmonary venous hypertension. No adenopathy. Moderate air is noted in the upper esophagus. Visualized trachea appears unremarkable. No bone lesions. IMPRESSION: Evidence pulmonary vascular congestion without edema or consolidation. Air in upper esophagus of uncertain etiology. Electronically Signed   By: Bretta BangWilliam  Woodruff III M.D.   On: 07/08/2016 08:11     ELGERGAWY, DAWOOD M.D on 07/10/2016 at 11:48 AM  Between 7am to 7pm - Pager - [phone]  After 7pm go to  [email or web address] - password [facility]  [facility] -  Office  [phone]

## 2016-07-10 NOTE — Progress Notes (Signed)
Attempted ABG x2 on patient and unsuccessful, had second RT attempt x1 stick, was unsuccessful. Patient then refused to be stuck anymore. RN aware. Patient still on BIPAP at this time.

## 2016-07-10 NOTE — Progress Notes (Signed)
[facility] Progress Note    Assessment/ Plan:   1.  AKI: Initially thought to be in the setting of HCTZ, lisinopril, Mobic--> but updated MAR from SNF received after admission shows she wasn't getting those meds?Marland Kitchen.  Unclear baseline.  She made 1 L of urine yesterday.  Her MS is better as is her hyperkalemia.  Therefore I will HOLD dialysis today and make further determinations about the need for more dialytic therapy on a daily basis.  2.  Sacral decub: per primary  3.  Hyperkalemia: resolved  4.  Encephalopathy: multifactorial, per primary; ? Occult sepsis respiratory failure appears to be the biggest driver here.  5.  Acute hypoxic and hypercarbic respiratory failure: Previously on continuous BiPaP, now off supplemental O2.  She'll likely need ongoing NIPPV at night.  Subjective:    Seen today in room.  Had dialysis yesterday.  She is less encephalopathic today.  Her family is at the bedside.  She reports that her R leg is tender.     Objective:   BP 121/65   Pulse 94   Temp 97.9 F (36.6 C) (Axillary)   Resp 15   Ht 5\' 4"  (1.626 m) Comment: from 06/25/16 encounter  Wt (!) 203.4 kg (448 lb 6.7 oz)   LMP  (LMP Unknown)   SpO2 98%   BMI 76.97 kg/m   Intake/Output Summary (Last 24 hours) at 07/10/16 1021 Last data filed at 07/10/16 0443  Gross per 24 hour  Intake                0 ml  Output             2000 ml  Net            -2000 ml   Weight change: 3.4 kg (7 lb 7.9 oz)  Physical Exam: GEN mental status a little better today.  NAD, lying in bed.   HEENT sclerae anicteric off BiPaP and [place] O2. NECK no JVD PULM bilateral anterior rhonchi improved CV RRR ABD obese EXT 1+ edema, Feet in boots NEURO opens eyes, is able to carry on a conversation today. SKIN no rashes on exposed skin; I was unable to view sacral decub personally myself   Imaging: Ct Head Wo Contrast  Result Date: 07/09/2016 CLINICAL DATA:  Initial evaluation for acute altered mental  status. EXAM: CT HEAD WITHOUT CONTRAST TECHNIQUE: Contiguous axial images were obtained from the base of the skull through the vertex without intravenous contrast. COMPARISON:  Prior CT from 06/03/2016. FINDINGS: Brain: Study limited by patient positioning and extensive motion artifact. Cerebral volume within normal limits. No obvious acute intracranial hemorrhage. Gray-white matter differentiation grossly maintained without definite evidence for acute large vessel territory infarct. No mass lesion, midline shift or mass effect. No hydrocephalus. No appreciable extra-axial fluid collection on this severely motion degraded exam. Vascular: No hyperdense vessel. Skull: Scalp soft tissues within normal limits. Calvarium grossly intact. Sinuses/Orbits: Globes and orbital soft tissues within normal limits. Paranasal sinuses are clear. No mastoid effusion. IMPRESSION: The Severely motion degraded exam. No obvious acute intracranial process identified. Electronically Signed   By: Rise MuBenjamin  McClintock M.D.   On: 07/09/2016 04:54   Dg Chest Port 1 View  Result Date: 07/10/2016 CLINICAL DATA:  Respiratory failure as well as chronic renal failure. EXAM: PORTABLE CHEST 1 VIEW COMPARISON:  07/08/2016 FINDINGS: Right IJ central venous catheter unchanged with tip over the SVC. Lungs are somewhat hypoinflated with mild stable prominence of the perihilar markings  likely mild vascular congestion. No lobar consolidation or effusion. Stable cardiomegaly. Remainder of the exam is unchanged. IMPRESSION: Stable cardiomegaly with mild stable vascular congestion. Right IJ central venous catheter unchanged. Electronically Signed   By: Elberta Fortis M.D.   On: 07/10/2016 08:22   Dg Chest Port 1 View  Result Date: 07/08/2016 CLINICAL DATA:  Encounter for central line placement. EXAM: PORTABLE CHEST 1 VIEW COMPARISON:  Radiograph of same day. FINDINGS: Stable cardiomegaly with central pulmonary vascular congestion. No pneumothorax or  pleural effusion is noted. Interval placement of right internal jugular catheter with distal tip in expected position of SVC. Bony thorax is unremarkable. IMPRESSION: Stable cardiomegaly with central pulmonary vascular congestion. Interval placement of right internal jugular catheter with distal tip in expected position of SVC. Electronically Signed   By: Lupita Raider, M.D.   On: 07/08/2016 13:16    Labs: BMET  Recent Labs Lab 07/08/16 0128 07/08/16 0753 07/08/16 1102 07/09/16 0110 07/09/16 0748 07/09/16 1210 07/09/16 1840 07/10/16 0030 07/10/16 0456  NA 129* 131* 130* 131*  --   --   --   --  135  K 6.9* 6.1* 6.1* 5.6* 6.2* 5.6* 4.6 5.8* 4.5  CL 92* 93* 93* 94*  --   --   --   --  98*  CO2 22 26 24 23   --   --   --   --  26  GLUCOSE 106* 98 96 97  --   --   --   --  79  BUN 87* 88* 90* 61*  --   --   --   --  46*  CREATININE 5.61* 5.32* 5.37* 3.96*  --   --   --   --  2.34*  CALCIUM 6.9* 6.9* 6.8* 7.0*  --   --   --   --  6.7*  PHOS  --   --  10.0*  --   --   --   --   --   --    CBC  Recent Labs Lab 07/08/16 0128 07/08/16 0753 07/08/16 1102 07/09/16 0314 07/10/16 0456  WBC 10.7* 10.0 9.3 9.1 10.7*  NEUTROABS 8.3*  --   --   --   --   HGB 9.2* 9.2* 9.0* 9.2* 8.8*  HCT 30.6* 30.4* 31.3* 31.4* 30.4*  MCV 84.3 84.4 86.0 86.3 85.9  PLT 620* 604* 610* 555* 470*    Medications:    . chlorhexidine  15 mL Mouth Rinse BID  . heparin  5,000 Units Subcutaneous Q8H  . insulin aspart  0-9 Units Subcutaneous Q4H  . levothyroxine  100 mcg Intravenous Daily  . mouth rinse  15 mL Mouth Rinse q12n4p      Bufford Buttner, MD [facility] pgr [phone] 07/10/2016, 10:21 AM

## 2016-07-10 NOTE — Progress Notes (Signed)
Pt observed attempting to pull HD cath from neck x2, mittens applied for patient safety

## 2016-07-10 NOTE — Progress Notes (Addendum)
Name: Diamond Parsons MRN: [id number] DOB: [birth year]    ADMISSION DATE:  07/07/2016 CONSULTATION DATE:  07/08/2016  REFERRING MD :  Dr. Randol Kern   CHIEF COMPLAINT:  Dyspnea   HISTORY OF PRESENT ILLNESS:   56 year old female with PMH of CKD, HTN, DM, Scral Decubitus Ulcer, and Anemia. Reported history of requiring HD. Last month admitted for right foot infection requiring amputation.   Presents to OSH on 6/21 from SNF on ACEi and NSAIDS per renal notes  with a K of 7.5. Upon arrival to ED patient was afebrile with oxygen saturation of 91% on RA, with no EKG changes. Patient was given insulin/glucose, Kayexalate, and bicarbonate and placed on 2L [place]. Transferred to [facility] for further work-up. Upon arrival to Hospital was encephalopathic with ABG of 7.23/58/55. Placed on BiPAP. CXR revealing of pulmonary vascular congestion without edema or consolidation. K 6.1, BUN 88, Creatinine 5.32. Nephrology consulted. PCCM consulted for respiratory distress.    SIGNIFICANT EVENTS  6/20 > Presents to OSH with Hyperkalemia  6/21> HD cath placed, started on dialysis  STUDIES:  CXR 6/21 > Evidence of pulmonary vascular congestion without edema or consolidation, air in upper esophagus. I have reviewed the images personally     SUBJECTIVE:  Rested on bipap overnight with sats ok on 0.30 FIO2  Wants to eat this am sats ok RA     VITAL SIGNS: Temp:  [97 F (36.1 C)-98.2 F (36.8 C)] 97.9 F (36.6 C) (06/23 0443) Pulse Rate:  [87-104] 94 (06/23 0600) Resp:  [7-23] 15 (06/23 0600) BP: (80-142)/(53-80) 121/65 (06/23 0600) SpO2:  [97 %-100 %] 98 % (06/23 0600) FiO2 (%):  [30 %] 30 % (06/23 0100) Weight:  [448 lb 6.7 oz (203.4 kg)-450 lb 9.9 oz (204.4 kg)] 448 lb 6.7 oz (203.4 kg) (06/22 1645)  PHYSICAL EXAMINATION: Gen:      No acute distress HEENT:  EOMI, sclera anicteric/ edentulous Neck:     No masses; no thyromegaly Lungs:    Distant bs, min insp crackles bilaterally  CV:          Regular rate and rhythm; no murmurs Abd:      Quite obese/+ bowel sounds; soft, non-tender; no palpable masses, no distension Ext:    1+ edema; rt foot amputation, adequate peripheral perfusion Skin:      Warm and dry; no rash Neuro:   Awake,   nl sensorium / no obvious motor def    Recent Labs Lab 07/08/16 1102 07/09/16 0110  07/09/16 1840 07/10/16 0030 07/10/16 0456  NA 130* 131*  --   --   --  135  K 6.1* 5.6*  < > 4.6 5.8* 4.5  CL 93* 94*  --   --   --  98*  CO2 24 23  --   --   --  26  BUN 90* 61*  --   --   --  46*  CREATININE 5.37* 3.96*  --   --   --  2.34*  GLUCOSE 96 97  --   --   --  79  < > = values in this interval not displayed.  Recent Labs Lab 07/08/16 1102 07/09/16 0314 07/10/16 0456  HGB 9.0* 9.2* 8.8*  HCT 31.3* 31.4* 30.4*  WBC 9.3 9.1 10.7*  PLT 610* 555* 470*   Ct Head Wo Contrast  Result Date: 07/09/2016 CLINICAL DATA:  Initial evaluation for acute altered mental status. EXAM: CT HEAD WITHOUT CONTRAST TECHNIQUE: Contiguous axial images  were obtained from the base of the skull through the vertex without intravenous contrast. COMPARISON:  Prior CT from 06/03/2016. FINDINGS: Brain: Study limited by patient positioning and extensive motion artifact. Cerebral volume within normal limits. No obvious acute intracranial hemorrhage. Gray-white matter differentiation grossly maintained without definite evidence for acute large vessel territory infarct. No mass lesion, midline shift or mass effect. No hydrocephalus. No appreciable extra-axial fluid collection on this severely motion degraded exam. Vascular: No hyperdense vessel. Skull: Scalp soft tissues within normal limits. Calvarium grossly intact. Sinuses/Orbits: Globes and orbital soft tissues within normal limits. Paranasal sinuses are clear. No mastoid effusion. IMPRESSION: The Severely motion degraded exam. No obvious acute intracranial process identified. Electronically Signed   By: Rise MuBenjamin  McClintock M.D.    On: 07/09/2016 04:54   Dg Chest Port 1 View  Result Date: 07/10/2016 CLINICAL DATA:  Respiratory failure as well as chronic renal failure. EXAM: PORTABLE CHEST 1 VIEW COMPARISON:  07/08/2016 FINDINGS: Right IJ central venous catheter unchanged with tip over the SVC. Lungs are somewhat hypoinflated with mild stable prominence of the perihilar markings likely mild vascular congestion. No lobar consolidation or effusion. Stable cardiomegaly. Remainder of the exam is unchanged. IMPRESSION: Stable cardiomegaly with mild stable vascular congestion. Right IJ central venous catheter unchanged. Electronically Signed   By: Elberta Fortisaniel  Boyle M.D.   On: 07/10/2016 08:22   Dg Chest Port 1 View  Result Date: 07/08/2016 CLINICAL DATA:  Encounter for central line placement. EXAM: PORTABLE CHEST 1 VIEW COMPARISON:  Radiograph of same day. FINDINGS: Stable cardiomegaly with central pulmonary vascular congestion. No pneumothorax or pleural effusion is noted. Interval placement of right internal jugular catheter with distal tip in expected position of SVC. Bony thorax is unremarkable. IMPRESSION: Stable cardiomegaly with central pulmonary vascular congestion. Interval placement of right internal jugular catheter with distal tip in expected position of SVC. Electronically Signed   By: Lupita RaiderJames  Green Jr, M.D.   On: 07/08/2016 13:16    ASSESSMENT / PLAN:  56 year old with chronic kidney disease, morbid obesity, acute on chronic kidney disease admitted with uremia, hyperkalemia, altered mental status and respiratory failure in setting of exp to nsaids/ACEi d/c'd on admit apparently (not clear as to last doses)   Acute Hypoxic Respiratory Distress in setting of Acute on Chronic KD  Respiratory Acidosis  Plan -marked improvement p HD and this am sats ok RA and wants to eat    Acute on Chronic Renal Failure  Hyperkalemia p exp to nsaids/acei  Plan  -Started on HD - Management per nephrology - clearly should not ever  rechallenge with acei or nsaids in this setting   Encephalopathy. Likely from metabolic causes with uremia, hypercarbia, hypothyroidism -Ammonia 29, AST/ALT 26/10  Plan  - Monitor neuro status  Hypothyroidism Lab Results  Component Value Date   TSH 13.595 (H) 07/08/2016    Free T 4  07/10/16 = 0.49  rec continue IV synthroid until able to take po well      Discussed with Renal/ nursing > clearly much improved and ok to use BiPAP prn though doubt this will be needed - PCCM f/u is prn   Key is mobilize/ careful rx with pain meds/ bp meds/ fluid status per [facility]/ Renal   Sandrea HughsMichael Deann Mclaine, MD Pulmonary and Critical Care Medicine [facility] Cell [phone] After 5:30 PM or weekends, use [facility] [phone]

## 2016-07-10 NOTE — Consult Note (Signed)
Hospital Consult    Reason for Consult:  Recent rle thrombectomy Requesting Physician:  Elgergawy MRN #:  [id number]  History of Present Illness:  56 y.o. female  With recent long hospitalization in [facility] for cholecystitis and also had apparent acute limb ischemia requirng rle thrombectomy via groin incision that was treat with incisional wound vac. She also ended up with proximal tma on the right by ortho. At discharge she was given eliquis and sent to snf. She is now in [place] from the snf with altered mental status and respiratory distress. She has had negative ct of head and remains stable now off of bipap. She is unable to give history and no family at bedside thus information obtained from Dr. Randol Kern, [facility] and her extensive outside records. She is stable on my exam with complaint of right foot pain  Past Medical History:  Diagnosis Date  . Anemia   . Chronic kidney disease   . Hypertension     History reviewed. No pertinent surgical history.  No Known Allergies  Prior to Admission medications   Medication Sig Start Date End Date Taking? Authorizing Provider  apixaban (ELIQUIS) 5 MG TABS tablet Take 5 mg by mouth 2 (two) times daily.   Yes [provider]  Insulin Glargine (BASAGLAR KWIKPEN) 100 UNIT/ML SOPN Inject 20 Units into the skin daily at 10 pm.   Yes [provider]    Social History   Social History  . Marital status: Married    Spouse name: N/A  . Number of children: N/A  . Years of education: N/A   Occupational History  . Not on file.   Social History Main Topics  . Smoking status: Never Smoker  . Smokeless tobacco: Never Used  . Alcohol use Not on file  . Drug use: Unknown  . Sexual activity: Not on file   Other Topics Concern  . Not on file   Social History Narrative  . No narrative on file     Family History  Problem Relation Age of Onset  . Family history unknown: Yes    ROS:  Cardiovascular: []  chest  pain/pressure []  palpitations []  SOB lying flat []  DOE []  pain in legs while walking [x]  pain in legs at rest []  pain in legs at night []  non-healing ulcers []  hx of DVT []  swelling in legs  Pulmonary: []  productive cough []  asthma/wheezing []  home O2  Neurologic: []  weakness in []  arms []  legs []  numbness in []  arms []  legs []  hx of CVA []  mini stroke [] difficulty speaking or slurred speech []  temporary loss of vision in one eye []  dizziness  Hematologic: []  hx of cancer []  bleeding problems []  problems with blood clotting easily  Endocrine:   []  diabetes []  thyroid disease  GI []  vomiting blood []  blood in stool  GU: []  CKD/renal failure []  HD--[]  M/W/F or []  T/T/S []  burning with urination []  blood in urine  Psychiatric: []  anxiety []  depression  Musculoskeletal: []  arthritis []  joint pain  Integumentary: []  rashes []  ulcers  Constitutional: []  fever []  chills   Physical Examination  Vitals:   07/10/16 1243 07/10/16 1313  BP:  126/86  Pulse:  96  Resp:  10  Temp: 98.4 F (36.9 C)    Body mass index is 76.97 kg/m.  General:  Obese, nad HENT: WNL, normocephalic Pulmonary: normal non-labored breathing Cardiac: palpable left dp Right dp and pt signals are mulitphasic Abdomen: soft, nontender, well healing port sitess  Extremities: right groin wound mostly healed with inferior aspect opening, no drainage or erythema Musculoskeletal: right tma site with lateral eschar, staples in place  Neurologic: awake, moves all extremities Psychiatric:  Not orietned   CBC    Component Value Date/Time   WBC 10.7 (H) 07/10/2016 0456   RBC 3.54 (L) 07/10/2016 0456   HGB 8.8 (L) 07/10/2016 0456   HCT 30.4 (L) 07/10/2016 0456   PLT 470 (H) 07/10/2016 0456   MCV 85.9 07/10/2016 0456   MCH 24.9 (L) 07/10/2016 0456   MCHC 28.9 (L) 07/10/2016 0456   RDW 22.7 (H) 07/10/2016 0456   LYMPHSABS 1.6 07/08/2016 0128   MONOABS 0.6 07/08/2016 0128   EOSABS  0.1 07/08/2016 0128   BASOSABS 0.1 07/08/2016 0128    BMET    Component Value Date/Time   NA 135 07/10/2016 0456   K 4.5 07/10/2016 0456   CL 98 (L) 07/10/2016 0456   CO2 26 07/10/2016 0456   GLUCOSE 79 07/10/2016 0456   BUN 46 (H) 07/10/2016 0456   CREATININE 2.34 (H) 07/10/2016 0456   CALCIUM 6.7 (L) 07/10/2016 0456   GFRNONAA 22 (L) 07/10/2016 0456   GFRAA 26 (L) 07/10/2016 0456    COAGS: No results found for: INR, PROTIME   Non-Invasive Vascular Imaging:   Outside hospital duplex demonstrates acute thrombus in sfa Later ABI 1.0 bilaterally  ASSESSMENT/PLAN: This is a 56 y.o. female s/p long hospitalization at outside facility now [facility] and has eschar of lateral tma site. Groin wound with 1 area of non-healing. Will obtain RLE duplex and ABI while here. Hesitant to intervene on tma site as proximal amputation will also have difficulty healing given her obesity. Notes do not describe reasoning for anticoagulation but if she needs dialysis and given her size she would likely best benefit from coumadin. Studies ordered.   Jennae Hakeem C. Randie Heinzain, MD [facility] [place] Office: [phone] Pager: [phone]

## 2016-07-10 NOTE — Progress Notes (Signed)
MD paged about patient having a positive c.diff. Awaiting response.

## 2016-07-10 NOTE — Evaluation (Signed)
Clinical/Bedside Swallow Evaluation Patient Details  Name: Diamond Parsons MRN: [id number] Date of Birth: 09/10/1960  Today's Date: 07/10/2016 Time: SLP Start Time (ACUTE ONLY): 1450 SLP Stop Time (ACUTE ONLY): 1458 SLP Time Calculation (min) (ACUTE ONLY): 8 min  Past Medical History:  Past Medical History:  Diagnosis Date  . Anemia   . Chronic kidney disease   . Hypertension    Past Surgical History: History reviewed. No pertinent surgical history. HPI:  56 year old female with past medical history of diabetes mellitus, hypertension, hypothyroidism, anemia, chronic kidney disease, with recent [facility]- 6/12, that hospital stay was noted for retired tests and sepsis secondary to cholecystitis status post laparoscopic cholecystectomy, acute arterial ischemia/gangrene of the right foot status post thrombectomy, and transtibial amputation of right foot, AKI on CKD stage III, where she required temporary hemodialysis, she was off hemodialysis on discharge. Patient was admitted for lethargy and abnormal labs at skilled nursing facility, she was started on hemodialysis for hyperkalemia, and history of present illness CKD, as well she was noted to have respiratory failure with hypercapnia requring BIPAP. Pt has been lethargic. Head CT negative. CXR also clear of infectious process.    Assessment / Plan / Recommendation Clinical Impression  Pt demonstrates normal swallow function, no signs of aspiration with consecutive sips. Pt can masticate solids without dentition and does so at baseline. Will initiate a regular diet and thin liquids and sign off.  SLP Visit Diagnosis: Dysphagia, unspecified (R13.10)    Aspiration Risk  Mild aspiration risk    Diet Recommendation Regular;Thin liquid   Liquid Administration via: Cup;Straw Medication Administration: Whole meds with liquid Supervision: Patient able to self feed    Other  Recommendations Oral Care  Recommendations: Oral care BID   Follow up Recommendations None      Frequency and Duration            Prognosis        Swallow Study   General HPI: 56 year old female with past medical history of diabetes mellitus, hypertension, hypothyroidism, anemia, chronic kidney disease, with recent prolonged [facility] stay [facility] from 5/16- 6/12, that hospital stay was noted for retired tests and sepsis secondary to cholecystitis status post laparoscopic cholecystectomy, acute arterial ischemia/gangrene of the right foot status post thrombectomy, and transtibial amputation of right foot, AKI on CKD stage III, where she required temporary hemodialysis, she was off hemodialysis on discharge. Patient was admitted for lethargy and abnormal labs at skilled nursing facility, she was started on hemodialysis for hyperkalemia, and history of present illness CKD, as well she was noted to have respiratory failure with hypercapnia requring BIPAP. Pt has been lethargic. Head CT negative. CXR also clear of infectious process.  Type of Study: Bedside Swallow Evaluation Diet Prior to this Study: NPO Temperature Spikes Noted: No Respiratory Status: Nasal cannula History of Recent Intubation: No Behavior/Cognition: Alert Oral Cavity Assessment: Within Functional Limits Oral Care Completed by SLP: No Oral Cavity - Dentition: Edentulous Self-Feeding Abilities: Able to feed self Patient Positioning: Partially reclined Baseline Vocal Quality: Hoarse Volitional Cough: Strong Volitional Swallow: Able to elicit    Oral/Motor/Sensory Function Overall Oral Motor/Sensory Function: Within functional limits   Ice Chips     Thin Liquid Thin Liquid: Within functional limits Presentation: Cup;Straw    Nectar Thick Nectar Thick Liquid: Not tested   Honey Thick Honey Thick Liquid: Not tested   Puree Puree: Within functional limits   Solid   GO  Solid: Within functional limits       Harlon Ditty,  MA CCC-SLP [phone]  Diamond Parsons, Diamond Parsons 07/10/2016,2:59 PM

## 2016-07-11 ENCOUNTER — Inpatient Hospital Stay (HOSPITAL_COMMUNITY): Payer: Medicare HMO

## 2016-07-11 DIAGNOSIS — Z9889 Other specified postprocedural states: Secondary | ICD-10-CM

## 2016-07-11 LAB — GLUCOSE, CAPILLARY
GLUCOSE-CAPILLARY: 76 mg/dL (ref 65–99)
GLUCOSE-CAPILLARY: 88 mg/dL (ref 65–99)
Glucose-Capillary: 106 mg/dL — ABNORMAL HIGH (ref 65–99)
Glucose-Capillary: 111 mg/dL — ABNORMAL HIGH (ref 65–99)
Glucose-Capillary: 80 mg/dL (ref 65–99)
Glucose-Capillary: 82 mg/dL (ref 65–99)
Glucose-Capillary: 83 mg/dL (ref 65–99)

## 2016-07-11 LAB — BASIC METABOLIC PANEL
Anion gap: 9 (ref 5–15)
BUN: 38 mg/dL — ABNORMAL HIGH (ref 6–20)
CHLORIDE: 102 mmol/L (ref 101–111)
CO2: 28 mmol/L (ref 22–32)
CREATININE: 1.67 mg/dL — AB (ref 0.44–1.00)
Calcium: 6.8 mg/dL — ABNORMAL LOW (ref 8.9–10.3)
GFR calc non Af Amer: 33 mL/min — ABNORMAL LOW (ref >60)
GFR, EST AFRICAN AMERICAN: 39 mL/min — AB (ref >60)
Glucose, Bld: 83 mg/dL (ref 65–99)
POTASSIUM: 4.1 mmol/L (ref 3.5–5.1)
SODIUM: 139 mmol/L (ref 135–145)

## 2016-07-11 LAB — CBC
HCT: 30.1 % — ABNORMAL LOW (ref 36.0–46.0)
HEMOGLOBIN: 8.7 g/dL — AB (ref 12.0–15.0)
MCH: 24.6 pg — ABNORMAL LOW (ref 26.0–34.0)
MCHC: 28.9 g/dL — ABNORMAL LOW (ref 30.0–36.0)
MCV: 85.3 fL (ref 78.0–100.0)
PLATELETS: 479 10*3/uL — AB (ref 150–400)
RBC: 3.53 MIL/uL — AB (ref 3.87–5.11)
RDW: 22.2 % — ABNORMAL HIGH (ref 11.5–15.5)
WBC: 10 10*3/uL (ref 4.0–10.5)

## 2016-07-11 LAB — VAS US LOWER EXTREMITY ARTERIAL DUPLEX
RATIBDISTSYS: -82 cm/s
RSFPPSV: 83 cm/s
RTIBDISTSYS: 37 cm/s
RTIBMIDSYS: 18 cm/s
Right popliteal dist sys PSV: -53 cm/s
Right popliteal prox sys PSV: -45 cm/s
Right post tibial sys PSV: 28 cm/s
Right super femoral dist sys PSV: 44 cm/s
Right super femoral mid sys PSV: 65 cm/s

## 2016-07-11 LAB — CK: Total CK: 230 U/L (ref 38–234)

## 2016-07-11 NOTE — Progress Notes (Addendum)
  Progress Note    07/11/2016 10:11 AM * No surgery found *  Subjective:  mulitple pain complaints today   Vitals:   07/11/16 0343 07/11/16 0741  BP: 116/78 (!) 148/75  Pulse: 91 96  Resp: 11 11  Temp: 97.7 F (36.5 C) 97.7 F (36.5 C)    Physical Exam: Awake and alert Right groin with 2 areas of opening otherwise healed Right foot dressing cdi  CBC    Component Value Date/Time   WBC 10.0 07/11/2016 0239   RBC 3.53 (L) 07/11/2016 0239   HGB 8.7 (L) 07/11/2016 0239   HCT 30.1 (L) 07/11/2016 0239   PLT 479 (H) 07/11/2016 0239   MCV 85.3 07/11/2016 0239   MCH 24.6 (L) 07/11/2016 0239   MCHC 28.9 (L) 07/11/2016 0239   RDW 22.2 (H) 07/11/2016 0239   LYMPHSABS 1.6 07/08/2016 0128   MONOABS 0.6 07/08/2016 0128   EOSABS 0.1 07/08/2016 0128   BASOSABS 0.1 07/08/2016 0128    BMET    Component Value Date/Time   NA 139 07/11/2016 0239   K 4.1 07/11/2016 0239   CL 102 07/11/2016 0239   CO2 28 07/11/2016 0239   GLUCOSE 83 07/11/2016 0239   BUN 38 (H) 07/11/2016 0239   CREATININE 1.67 (H) 07/11/2016 0239   CALCIUM 6.8 (L) 07/11/2016 0239   GFRNONAA 33 (L) 07/11/2016 0239   GFRAA 39 (L) 07/11/2016 0239    INR No results found for: INR   Intake/Output Summary (Last 24 hours) at 07/11/16 1011 Last data filed at 07/11/16 0545  Gross per 24 hour  Intake             1345 ml  Output              952 ml  Net              393 ml     Assessment/Plan:  56 y.o. female is admitted here from snf following long hospitalization [place] requiring rle thrombectomy and tma. The tma site is dry with eschar laterally and staples in place. Will check rle duplex and abi. Allow right foot to demarcate as intervention likely to require proximal amputation that will also be difficult to heal. Keep right groin dry.    Smt Lokey C. Randie Heinzain, MD [facility] [place] Office: [phone] Pager: [phone]  07/11/2016 10:11 AM   Duplex and ABI reviewed with  patency to foot mostly via AT consistent with physical exam. Her groin is healing and needs to have a dry dressing at all times. No intervention for her right tma site and allow to demarcate. Should follow-up with primary surgeons following discharge. Please call with questions.   Lemar LivingsBrandon Keonna Raether

## 2016-07-11 NOTE — Progress Notes (Signed)
VASCULAR LAB PRELIMINARY  ARTERIAL  ABI completed: Duplex imaging: Unable to visualize the right CFA secondary to bandaging and body habitus.  Adequate flow noted throughout femoral, popliteal, and anterior tibial arteries.   Monophasic flow noted in the posterior tibial artery.     RIGHT    LEFT    PRESSURE WAVEFORM  PRESSURE WAVEFORM  BRACHIAL Arm too large  BRACHIAL Arm too large   DP   DP    AT 168 B AT BP cuff B  PT bandaging  PT BP cuff   PER   PER    GREAT TOE  NA GREAT TOE  NA    RIGHT LEFT  ABI       Diamond Parsons, RVT 07/11/2016, 4:57 PM

## 2016-07-11 NOTE — Progress Notes (Addendum)
[facility] Progress Note    Assessment/ Plan:   1.  AKI: Initially thought to be in the setting of HCTZ, lisinopril, Mobic--> but updated MAR from SNF received after admission shows she wasn't getting those meds?Marland Kitchen  Unclear baseline but looks to be around 1.8.  Required dialysis 6/21 and 6/22.  Making urine, creatinine downtrending --> hopefully will not require any more dialysis.  She is not a long-term candidate due to size, immobility, and other comorbidities.  Has a R nontunneled HD cath that can be removed if not going to require any more dialysis.   2.  Sacral decub: per primary  3.  Hyperkalemia: resolved  4.  Encephalopathy: multifactorial, per primary; improving  5.  Acute hypoxic and hypercarbic respiratory failure: Previously on continuous BiPaP, now off supplemental O2.  She'll likely need ongoing NIPPV at night.  6.  R foot infection: VVS consulted, getting ABIs  7.  C diff colitis- on PO vanc (6/23-)  Subjective:    More alert today.  Asking for food and to go home.     Objective:   BP (!) 148/75 (BP Location: Left Arm)   Pulse 96   Temp 97.7 F (36.5 C) (Oral)   Resp 11   Ht 5\' 4"  (1.626 m) Comment: from 06/25/16 encounter  Wt (!) 203.4 kg (448 lb 6.7 oz)   LMP  (LMP Unknown)   SpO2 100%   BMI 76.97 kg/m   Intake/Output Summary (Last 24 hours) at 07/11/16 0924 Last data filed at 07/11/16 0545  Gross per 24 hour  Intake             1345 ml  Output              952 ml  Net              393 ml   Weight change:   Physical Exam: GEN NAD, lying in bed, able to have a conversation today. HEENT sclerae anicteric off BiPaP and [place] O2. NECK no JVD PULM bilateral anterior rhonchi improved CV RRR ABD obese EXT 1+ edema, Feet in boots, R foot dressed, R groin with dressing and L medial thigh with dressing NEURO opens eyes, is able to carry on a conversation today. SKIN no rashes on exposed skin;  Imaging: Dg Chest Port 1 View  Result Date:  07/10/2016 CLINICAL DATA:  Respiratory failure as well as chronic renal failure. EXAM: PORTABLE CHEST 1 VIEW COMPARISON:  07/08/2016 FINDINGS: Right IJ central venous catheter unchanged with tip over the SVC. Lungs are somewhat hypoinflated with mild stable prominence of the perihilar markings likely mild vascular congestion. No lobar consolidation or effusion. Stable cardiomegaly. Remainder of the exam is unchanged. IMPRESSION: Stable cardiomegaly with mild stable vascular congestion. Right IJ central venous catheter unchanged. Electronically Signed   By: Elberta Fortis M.D.   On: 07/10/2016 08:22    Labs: BMET  Recent Labs Lab 07/08/16 0128 07/08/16 0753 07/08/16 1102 07/09/16 0110 07/09/16 0748 07/09/16 1210 07/09/16 1840 07/10/16 0030 07/10/16 0456 07/11/16 0239  NA 129* 131* 130* 131*  --   --   --   --  135 139  K 6.9* 6.1* 6.1* 5.6* 6.2* 5.6* 4.6 5.8* 4.5 4.1  CL 92* 93* 93* 94*  --   --   --   --  98* 102  CO2 22 26 24 23   --   --   --   --  26 28  GLUCOSE 106* 98 96  97  --   --   --   --  79 83  BUN 87* 88* 90* 61*  --   --   --   --  46* 38*  CREATININE 5.61* 5.32* 5.37* 3.96*  --   --   --   --  2.34* 1.67*  CALCIUM 6.9* 6.9* 6.8* 7.0*  --   --   --   --  6.7* 6.8*  PHOS  --   --  10.0*  --   --   --   --   --   --   --    CBC  Recent Labs Lab 07/08/16 0128  07/08/16 1102 07/09/16 0314 07/10/16 0456 07/11/16 0239  WBC 10.7*  < > 9.3 9.1 10.7* 10.0  NEUTROABS 8.3*  --   --   --   --   --   HGB 9.2*  < > 9.0* 9.2* 8.8* 8.7*  HCT 30.6*  < > 31.3* 31.4* 30.4* 30.1*  MCV 84.3  < > 86.0 86.3 85.9 85.3  PLT 620*  < > 610* 555* 470* 479*  < > = values in this interval not displayed.  Medications:    . chlorhexidine  15 mL Mouth Rinse BID  . heparin  5,000 Units Subcutaneous Q8H  . insulin aspart  0-9 Units Subcutaneous Q4H  . levothyroxine  100 mcg Intravenous Daily  . mouth rinse  15 mL Mouth Rinse q12n4p  . vancomycin  125 mg Oral Q6H      Bufford ButtnerElizabeth Lilyian Quayle,  MD [facility] pgr [phone] 07/11/2016, 9:24 AM

## 2016-07-11 NOTE — Progress Notes (Signed)
PROGRESS NOTE                                                                                                                                                                                                             Patient Demographics:    Diamond Parsons, is a 56 y.o. female, DOB - Jan 12, 1961, ZOX:[id number]  Admit date - 07/07/2016   Admitting Physician Eduard Clos, MD  Outpatient Primary MD for the patient is Patient, No Pcp Per  LOS - 4   No chief complaint on file.      Brief Narrative   56 year old female with past medical history of diabetes mellitus, hypertension, hypothyroidism, anemia, chronic kidney disease, with recent prolonged hospital stay at [facility] from 5/16-  6/12, that hospital stay was noted for retired tests and sepsis secondary to cholecystitis status post laparoscopic cholecystectomy, acute arterial ischemia/gangrene of the right foot status post thrombectomy, and transtibial amputation of right foot, AKI on CKD stage III, where she required temporary hemodialysis, she was off hemodialysis on discharge. Patient was admitted for lethargy and abnormal labs at skilled nursing facility, she was started on hemodialysis for hyperkalemia, and history of present illness CKD, as well she was noted to have respiratory failure with hypercapnia where she is requiring continuous BiPAP.   Subjective:    Diamond Parsons today Is more awake and appropriate, she did develop diarrhea yesterday, where she required flexiseal, complaints of right lower extremity pain, otherwise denies any chest pain, shortness of breath, fever or chills.   Assessment  & Plan :    Principal Problem:   Acute encephalopathy Active Problems:   Acute on chronic renal failure (HCC)   Leg wound, right   Hypothyroidism   Hyperkalemia   Anemia due to end stage renal disease (HCC)   Pressure injury of skin   Acute respiratory failure with hypoxia and  hypercapnia (HCC)   Acute respiratory failure (HCC)   Acute on chronic alteration in mental status   Acute hypoxic/hypercapnic respiratory failure - This is most likely in the setting OHS, and encephalopathy from uremia. - She did require continuous BiPAP for the first 48 hours, was seen by PCCM, significantly improved, no BiPAP requirement of of the last 24 hours . - PCCM signed off. - Continue with oxygen and wean as tolerated  - Continue  C Pap at bedtime given known OSA  AKI on CK D stage III  - Baseline creatinine around 1.2, new creatinine baseline was 1.8 upon [facility], she presents with a creatinine of right 5.6 on admission, and hyperkalemia with potassium of 6.9. - Patient was started on hemodialysis per renal, not a long-term dialysis candidate, she has temporary hemodialysis catheter inserted by critical care, dialysis decision based on day-to-day basis, renal function continues to improve after dialysis, creatinine is 1.6 today, as well she is having 1 L of urine output over last 24 hours , continue to monitor off dialysis. Renal .  C. difficile diarrhea - Started on oral vancomycin  Hyperkalemia - Resolved with dialysis  Multiple skin wound/Sacral decubitus ulcer - Continue with wound care, wound care consulted.  status  post right foot metatarsal amputation - Secondary to acute arterial ischemia and gangrene of the right foot, status post thromb[facility]. - Vascular surgery input greatly appreciated, duplex and ABI pending for today  Acute Encephalopathy - Appears to be multifactorial, secondary to hypercapnia and uremia Metabolic versus uremic - Significantly improved, passed swallow evaluation  Hypothyroidism - Continue Synthroid, she has elevated TSH, and low free T4, so will increase her Synthroid dose  Diabetes mellitus - Continue with insulin sliding scale  Hypertension - blood Pressure is acceptable off  medication  OSA - Currently on BiPAP when necessary, meanwhile will continue with C Pap at bedtime   Patient appears to be on Eliquis was recently discharged from [facility], there is no evidence of A. fib, PE or DVT, unclear why she is needing it.  Code Status : Full  Family Communication  : None at bedside  Disposition Plan  : Back to SNF once stable   Consults  : Renal , PCCM, vascular surgery  Procedures  : Temporary dialysis catheter insertion  DVT Prophylaxis  :   Heparin   Lab Results  Component Value Date   PLT 479 (H) 07/11/2016    Antibiotics  :    Anti-infectives    Start     Dose/Rate Route Frequency Ordered Stop   07/10/16 1800  vancomycin (VANCOCIN) 50 mg/mL oral solution 125 mg     125 mg Oral Every 6 hours 07/10/16 1749          Objective:   Vitals:   07/11/16 0741 07/11/16 0800 07/11/16 0900 07/11/16 1000  BP: (!) 148/75     Pulse: 96 84 (!) 102 92  Resp: 11 14 12 17   Temp: 97.7 F (36.5 C)     TempSrc: Oral     SpO2: 100% 100% 100% 100%  Weight:      Height:        Wt Readings from Last 3 Encounters:  07/09/16 (!) 203.4 kg (448 lb 6.7 oz)     Intake/Output Summary (Last 24 hours) at 07/11/16 1054 Last data filed at 07/11/16 0545  Gross per 24 hour  Intake             1345 ml  Output              952 ml  Net              393 ml     Physical Exam  Awake alert oriented 3, able to have conversation today and answered questions appropriately Good air entry bilaterally, no wheezing or rales rhonchi , no use of accessory muscle  Regular rate and  rhythm, no rubs murmurs or gallops  Abdomen obese, soft, nontender, nondistended, bowel sounds present  Trace edema, right foot dressed, right groin with dressing in right groin wound.   .   Data Review:    CBC  Recent Labs Lab 07/08/16 0128 07/08/16 0753 07/08/16 1102 07/09/16 0314 07/10/16 0456 07/11/16 0239  WBC 10.7* 10.0 9.3 9.1 10.7* 10.0  HGB 9.2* 9.2* 9.0* 9.2*  8.8* 8.7*  HCT 30.6* 30.4* 31.3* 31.4* 30.4* 30.1*  PLT 620* 604* 610* 555* 470* 479*  MCV 84.3 84.4 86.0 86.3 85.9 85.3  MCH 25.3* 25.6* 24.7* 25.3* 24.9* 24.6*  MCHC 30.1 30.3 28.8* 29.3* 28.9* 28.9*  RDW 22.3* 22.4* 22.7* 22.8* 22.7* 22.2*  LYMPHSABS 1.6  --   --   --   --   --   MONOABS 0.6  --   --   --   --   --   EOSABS 0.1  --   --   --   --   --   BASOSABS 0.1  --   --   --   --   --     Chemistries   Recent Labs Lab 07/08/16 0128 07/08/16 0753 07/08/16 1102 07/09/16 0110  07/09/16 1210 07/09/16 1840 07/10/16 0030 07/10/16 0456 07/11/16 0239  NA 129* 131* 130* 131*  --   --   --   --  135 139  K 6.9* 6.1* 6.1* 5.6*  < > 5.6* 4.6 5.8* 4.5 4.1  CL 92* 93* 93* 94*  --   --   --   --  98* 102  CO2 22 26 24 23   --   --   --   --  26 28  GLUCOSE 106* 98 96 97  --   --   --   --  79 83  BUN 87* 88* 90* 61*  --   --   --   --  46* 38*  CREATININE 5.61* 5.32* 5.37* 3.96*  --   --   --   --  2.34* 1.67*  CALCIUM 6.9* 6.9* 6.8* 7.0*  --   --   --   --  6.7* 6.8*  AST 26  --   --   --   --   --   --   --   --   --   ALT 10*  --   --   --   --   --   --   --   --   --   ALKPHOS 84  --   --   --   --   --   --   --   --   --   BILITOT 0.4  --   --   --   --   --   --   --   --   --   < > = values in this interval not displayed. ------------------------------------------------------------------------------------------------------------------ No results for input(s): CHOL, HDL, LDLCALC, TRIG, CHOLHDL, LDLDIRECT in the last 72 hours.  No results found for: HGBA1C ------------------------------------------------------------------------------------------------------------------ No results for input(s): TSH, T4TOTAL, T3FREE, THYROIDAB in the last 72 hours.  Invalid input(s): FREET3 ------------------------------------------------------------------------------------------------------------------ No results for input(s): VITAMINB12, FOLATE, FERRITIN, TIBC, IRON, RETICCTPCT in the  last 72 hours.  Coagulation profile No results for input(s): INR, PROTIME in the last 168 hours.  No results for input(s): DDIMER in the last 72 hours.  Cardiac Enzymes No results for input(s): CKMB, TROPONINI, MYOGLOBIN in the last  168 hours.  Invalid input(s): CK ------------------------------------------------------------------------------------------------------------------    Component Value Date/Time   BNP 76.1 07/08/2016 0753    Inpatient Medications  Scheduled Meds: . chlorhexidine  15 mL Mouth Rinse BID  . heparin  5,000 Units Subcutaneous Q8H  . insulin aspart  0-9 Units Subcutaneous Q4H  . levothyroxine  100 mcg Intravenous Daily  . mouth rinse  15 mL Mouth Rinse q12n4p  . vancomycin  125 mg Oral Q6H   Continuous Infusions: . sodium chloride    . sodium chloride     PRN Meds:.sodium chloride, sodium chloride, acetaminophen **OR** acetaminophen, alteplase, heparin, lidocaine (PF), lidocaine-prilocaine, ondansetron **OR** ondansetron (ZOFRAN) IV, pentafluoroprop-tetrafluoroeth  Micro Results Recent Results (from the past 240 hour(s))  MRSA PCR Screening     Status: None   Collection Time: 07/08/16  4:18 AM  Result Value Ref Range Status   MRSA by PCR NEGATIVE NEGATIVE Final    Comment:        The GeneXpert MRSA Assay (FDA approved for NASAL specimens only), is one component of a comprehensive MRSA colonization surveillance program. It is not intended to diagnose MRSA infection nor to guide or monitor treatment for MRSA infections.   C difficile quick scan w PCR reflex     Status: Abnormal   Collection Time: 07/10/16  2:35 PM  Result Value Ref Range Status   C Diff antigen POSITIVE (A) NEGATIVE Final   C Diff toxin POSITIVE (A) NEGATIVE Final   C Diff interpretation Toxin producing C. difficile detected.  Final    Comment: CRITICAL RESULT CALLED TO, READ BACK BY AND VERIFIED WITH: C WHITESIDE,RN AT 1743 07/10/16 BY L BENFIELD     Radiology  Reports Ct Head Wo Contrast  Result Date: 07/09/2016 CLINICAL DATA:  Initial evaluation for acute altered mental status. EXAM: CT HEAD WITHOUT CONTRAST TECHNIQUE: Contiguous axial images were obtained from the base of the skull through the vertex without intravenous contrast. COMPARISON:  Prior CT from 06/03/2016. FINDINGS: Brain: Study limited by patient positioning and extensive motion artifact. Cerebral volume within normal limits. No obvious acute intracranial hemorrhage. Gray-white matter differentiation grossly maintained without definite evidence for acute large vessel territory infarct. No mass lesion, midline shift or mass effect. No hydrocephalus. No appreciable extra-axial fluid collection on this severely motion degraded exam. Vascular: No hyperdense vessel. Skull: Scalp soft tissues within normal limits. Calvarium grossly intact. Sinuses/Orbits: Globes and orbital soft tissues within normal limits. Paranasal sinuses are clear. No mastoid effusion. IMPRESSION: The Severely motion degraded exam. No obvious acute intracranial process identified. Electronically Signed   By: Rise Mu M.D.   On: 07/09/2016 04:54   Dg Chest Port 1 View  Result Date: 07/10/2016 CLINICAL DATA:  Respiratory failure as well as chronic renal failure. EXAM: PORTABLE CHEST 1 VIEW COMPARISON:  07/08/2016 FINDINGS: Right IJ central venous catheter unchanged with tip over the SVC. Lungs are somewhat hypoinflated with mild stable prominence of the perihilar markings likely mild vascular congestion. No lobar consolidation or effusion. Stable cardiomegaly. Remainder of the exam is unchanged. IMPRESSION: Stable cardiomegaly with mild stable vascular congestion. Right IJ central venous catheter unchanged. Electronically Signed   By: Elberta Fortis M.D.   On: 07/10/2016 08:22   Dg Chest Port 1 View  Result Date: 07/08/2016 CLINICAL DATA:  Encounter for central line placement. EXAM: PORTABLE CHEST 1 VIEW COMPARISON:   Radiograph of same day. FINDINGS: Stable cardiomegaly with central pulmonary vascular congestion. No pneumothorax or pleural effusion is noted. Interval placement of right  internal jugular catheter with distal tip in expected position of SVC. Bony thorax is unremarkable. IMPRESSION: Stable cardiomegaly with central pulmonary vascular congestion. Interval placement of right internal jugular catheter with distal tip in expected position of SVC. Electronically Signed   By: Lupita Raider, M.D.   On: 07/08/2016 13:16   Dg Chest Port 1 View  Result Date: 07/08/2016 CLINICAL DATA:  Hypoxia EXAM: PORTABLE CHEST 1 VIEW COMPARISON:  None. FINDINGS: There is no edema or consolidation. There is cardiomegaly with pulmonary venous hypertension. No adenopathy. Moderate air is noted in the upper esophagus. Visualized trachea appears unremarkable. No bone lesions. IMPRESSION: Evidence pulmonary vascular congestion without edema or consolidation. Air in upper esophagus of uncertain etiology. Electronically Signed   By: Bretta Bang III M.D.   On: 07/08/2016 08:11     Verina Galeno M.D on 07/11/2016 at 10:54 AM  Between 7am to 7pm - Pager - [phone]  After 7pm go to [email or web address] - password [facility]  [facility] -  Office  [phone]

## 2016-07-12 ENCOUNTER — Inpatient Hospital Stay (HOSPITAL_COMMUNITY): Payer: Medicare HMO

## 2016-07-12 LAB — BASIC METABOLIC PANEL
Anion gap: 11 (ref 5–15)
BUN: 28 mg/dL — AB (ref 6–20)
CALCIUM: 6.9 mg/dL — AB (ref 8.9–10.3)
CHLORIDE: 103 mmol/L (ref 101–111)
CO2: 26 mmol/L (ref 22–32)
CREATININE: 1.25 mg/dL — AB (ref 0.44–1.00)
GFR calc Af Amer: 55 mL/min — ABNORMAL LOW (ref >60)
GFR calc non Af Amer: 48 mL/min — ABNORMAL LOW (ref >60)
GLUCOSE: 108 mg/dL — AB (ref 65–99)
Potassium: 3.9 mmol/L (ref 3.5–5.1)
Sodium: 140 mmol/L (ref 135–145)

## 2016-07-12 LAB — GLUCOSE, CAPILLARY
GLUCOSE-CAPILLARY: 95 mg/dL (ref 65–99)
GLUCOSE-CAPILLARY: 97 mg/dL (ref 65–99)
GLUCOSE-CAPILLARY: 94 mg/dL (ref 65–99)
Glucose-Capillary: 115 mg/dL — ABNORMAL HIGH (ref 65–99)
Glucose-Capillary: 96 mg/dL (ref 65–99)

## 2016-07-12 LAB — CBC
HEMATOCRIT: 31 % — AB (ref 36.0–46.0)
HEMOGLOBIN: 9.3 g/dL — AB (ref 12.0–15.0)
MCH: 25.5 pg — AB (ref 26.0–34.0)
MCHC: 30 g/dL (ref 30.0–36.0)
MCV: 84.9 fL (ref 78.0–100.0)
Platelets: 457 10*3/uL — ABNORMAL HIGH (ref 150–400)
RBC: 3.65 MIL/uL — ABNORMAL LOW (ref 3.87–5.11)
RDW: 22.1 % — AB (ref 11.5–15.5)
WBC: 10.6 10*3/uL — ABNORMAL HIGH (ref 4.0–10.5)

## 2016-07-12 MED ORDER — METOCLOPRAMIDE HCL 5 MG/ML IJ SOLN
10.0000 mg | Freq: Once | INTRAMUSCULAR | Status: AC
  Administered 2016-07-12: 10 mg via INTRAVENOUS
  Filled 2016-07-12: qty 2

## 2016-07-12 MED ORDER — VITAMIN D (ERGOCALCIFEROL) 1.25 MG (50000 UNIT) PO CAPS: 50000.0000 [IU] | ORAL_CAPSULE | ORAL | Status: DC

## 2016-07-12 MED ORDER — MORPHINE SULFATE (PF) 4 MG/ML IV SOLN
4.0000 mg | Freq: Once | INTRAVENOUS | Status: AC
  Administered 2016-07-12: 4 mg via INTRAVENOUS
  Filled 2016-07-12: qty 1

## 2016-07-12 MED ORDER — RIVAROXABAN 20 MG PO TABS
20.0000 mg | ORAL_TABLET | Freq: Every day | ORAL | Status: DC
  Administered 2016-07-13 – 2016-07-15 (×3): 20 mg via ORAL
  Filled 2016-07-12 (×3): qty 1

## 2016-07-12 MED ORDER — ERGOCALCIFEROL 1.25 MG (50000 UT) PO CAPS: 50000.0000 [IU] | ORAL_CAPSULE | ORAL | Status: DC

## 2016-07-12 MED ORDER — SERTRALINE HCL 100 MG PO TABS
100.0000 mg | ORAL_TABLET | Freq: Every day | ORAL | Status: DC
  Administered 2016-07-12 – 2016-07-14 (×3): 100 mg via ORAL
  Filled 2016-07-12 (×3): qty 1

## 2016-07-12 MED ORDER — OXYCODONE-ACETAMINOPHEN 5-325 MG PO TABS: 1.0000 | ORAL_TABLET | ORAL | Status: DC | PRN

## 2016-07-12 MED ORDER — PRO-STAT SUGAR FREE PO LIQD
30.0000 mL | Freq: Three times a day (TID) | ORAL | Status: DC
  Administered 2016-07-12 – 2016-07-14 (×3): 30 mL via ORAL
  Filled 2016-07-12 (×7): qty 30

## 2016-07-12 MED ORDER — RIVAROXABAN 15 MG PO TABS: 15.0000 mg | ORAL_TABLET | Freq: Two times a day (BID) | ORAL | Status: DC

## 2016-07-12 MED ORDER — LEVOTHYROXINE SODIUM 100 MCG PO TABS
250.0000 ug | ORAL_TABLET | Freq: Every day | ORAL | Status: DC
  Administered 2016-07-13 – 2016-07-15 (×3): 250 ug via ORAL
  Filled 2016-07-12 (×3): qty 1

## 2016-07-12 MED ORDER — LEVOTHYROXINE SODIUM 100 MCG PO TABS: 200.0000 ug | ORAL_TABLET | Freq: Every day | ORAL | Status: DC

## 2016-07-12 MED ORDER — BUSPIRONE HCL 5 MG PO TABS
5.0000 mg | ORAL_TABLET | Freq: Three times a day (TID) | ORAL | Status: DC
  Administered 2016-07-12 – 2016-07-15 (×9): 5 mg via ORAL
  Filled 2016-07-12 (×11): qty 1

## 2016-07-12 NOTE — Progress Notes (Signed)
Assessment/ Plan:   1. AKI: Improved, ? CKD  Plan : We will sign off.  I think you can remove HD catheter when you no longer need it.     Subjective: Interval History: Feels ok  Objective: Vital signs in last 24 hours: Temp:  [97.1 F (36.2 C)-99.1 F (37.3 C)] 99.1 F (37.3 C) (06/25 1202) Pulse Rate:  [53-96] 91 (06/25 0749) Resp:  [11-22] 13 (06/25 1202) BP: (121-153)/(72-99) 153/90 (06/25 1202) SpO2:  [53 %-100 %] 99 % (06/25 1202) Weight change:   Intake/Output from previous day: 06/24 0701 - 06/25 0700 In: 120 [P.O.:120] Out: 1250 [Urine:850; Stool:400] Intake/Output this shift: Total I/O In: -  Out: 550 [Urine:550]  General appearance: alert and cooperative Chest wall: no tenderness GI: soft, non-tender; bowel sounds normal; no masses,  no organomegaly and obhese Extremities: edema 2+ pretib with heel protectors  Lab Results:  Recent Labs  07/11/16 0239 07/12/16 0246  WBC 10.0 10.6*  HGB 8.7* 9.3*  HCT 30.1* 31.0*  PLT 479* 457*   BMET:  Recent Labs  07/11/16 0239 07/12/16 0246  NA 139 140  K 4.1 3.9  CL 102 103  CO2 28 26  GLUCOSE 83 108*  BUN 38* 28*  CREATININE 1.67* 1.25*  CALCIUM 6.8* 6.9*   No results for input(s): PTH in the last 72 hours. Iron Studies: No results for input(s): IRON, TIBC, TRANSFERRIN, FERRITIN in the last 72 hours. Studies/Results: No results found.  Scheduled: . chlorhexidine  15 mL Mouth Rinse BID  . feeding supplement (PRO-STAT SUGAR FREE 64)  30 mL Oral TID BM  . heparin  5,000 Units Subcutaneous Q8H  . insulin aspart  0-9 Units Subcutaneous Q4H  . levothyroxine  100 mcg Intravenous Daily  . mouth rinse  15 mL Mouth Rinse q12n4p  . vancomycin  125 mg Oral Q6H     LOS: 5 days   Diamond Parsons C 07/12/2016,12:21 PM

## 2016-07-12 NOTE — Discharge Instructions (Signed)
Information on my medicine - XARELTO (Rivaroxaban)  This medication education was reviewed with me or my healthcare representative as part of my discharge preparation.  Why was Xarelto prescribed for you? Xarelto was prescribed for you to reduce the risk of a blood clot forming risk with arterial ischemia.  What do you need to know about xarelto ? Take your Xarelto ONCE DAILY at the same time every day with your evening meal. If you have difficulty swallowing the tablet whole, you may crush it and mix in applesauce just prior to taking your dose.  Take Xarelto exactly as prescribed by your doctor and DO NOT stop taking Xarelto without talking to the doctor who prescribed the medication.  Stopping without other stroke prevention medication to take the place of Xarelto may increase your risk of developing a clot that causes a stroke.  Refill your prescription before you run out.  After discharge, you should have regular check-up appointments with your healthcare provider that is prescribing your Xarelto.  In the future your dose may need to be changed if your kidney function or weight changes by a significant amount.  What do you do if you miss a dose? If you are taking Xarelto ONCE DAILY and you miss a dose, take it as soon as you remember on the same day then continue your regularly scheduled once daily regimen the next day. Do not take two doses of Xarelto at the same time or on the same day.   Important Safety Information A possible side effect of Xarelto is bleeding. You should call your healthcare provider right away if you experience any of the following: ? Bleeding from an injury or your nose that does not stop. ? Unusual colored urine (red or dark brown) or unusual colored stools (red or black). ? Unusual bruising for unknown reasons. ? A serious fall or if you hit your head (even if there is no bleeding).  Some medicines may interact with Xarelto and might increase your  risk of bleeding while on Xarelto. To help avoid this, consult your healthcare provider or pharmacist prior to using any new prescription or non-prescription medications, including herbals, vitamins, non-steroidal anti-inflammatory drugs (NSAIDs) and supplements.  This website has more information on Xarelto: [email or web address].xarelto.[email or web address].

## 2016-07-12 NOTE — Consult Note (Addendum)
WOC consult requested for groin, abd and foot wound.  This was already performed on 6/21; refer to previous progress notes for wound assessment and plan of care.  Vascular team is now following for groin and foot wounds; they have ordered a dry dressing to the groin and want to allow the foot wound to demarcate.  Refer to their team for questions. Please re-consult if further assistance is needed.  Thank-you,  Cammie Mcgeeawn Etheline Geppert MSN, RN, CWOCN, [place]AP, CNS [phone]

## 2016-07-12 NOTE — Progress Notes (Signed)
Vomited 3x with undigested food after eating. MD made aware. cont. to monitor.

## 2016-07-12 NOTE — Clinical Social Work Note (Signed)
Clinical Social Work Assessment  Patient Details  Name: Diamond Parsons MRN: [id number] Date of Birth: 1960-03-15  Date of referral:  07/12/16               Reason for consult:  Facility Placement                Permission sought to share information with:  Family Supports Permission granted to share information::  Yes, Verbal Permission Granted  Name::     Nicolasa Ducking  Agency::  SNF   Relationship::  Son   Contact Information:  [phone]  Housing/Transportation Living arrangements for the past 2 months:  [place] of Information:  Patient, Adult Children Patient Interpreter Needed:  None Criminal Activity/Legal Involvement Pertinent to Current Situation/Hospitalization:  No - Comment as needed Significant Relationships:  Adult Children Lives with:    Do you feel safe going back to the place where you live?  Yes Need for family participation in patient care:  Yes (Comment)  Care giving concerns: Pt had adult sons both at bedside. Per pt report, pt has support from family and is open to going to a SNF at the time of discharge.    Social Worker assessment / plan:  CSW met with pt and adult sons both at bedside to discuss discharge options for pt once medically stable for discharge. CSW presented the option of going to a SNF once discharge and pt and sons were both open to the idea. Pt and pt' s sons expressed concerns about pt returning back to current facility [facility]) and asked that pt be placed in either [facility] or [facility] upon being discharge. CSW initiates SNF search om behalf of pt. CSW will make pt aware of bed offers made, once they are accessible. No further concerns were presented during this time.   Employment status:  Unemployed Nurse, adult PT Recommendations:  Not assessed at this time Information / Referral to community resources:  [place]  Patient/Family's Response to care:  Pt is  agreeable to being placed in a SNF at this time, but has hopes of returning home after rehab.   Patient/Family's Understanding of and Emotional Response to Diagnosis, Current Treatment, and Prognosis:  Pt's sons are agreeable on pt being placed within a SNF. Both expressed that they do not want pt back at [facility] but are open to [facility] and [facility].  Emotional Assessment Appearance:  Appears stated age Attitude/Demeanor/Rapport:  Other (Pt expressed anger regarding the care that pt received last night. ) Affect (typically observed):  Accepting, Appropriate, Pleasant Orientation:  Oriented to Self, Oriented to Place, Oriented to  Time, Oriented to Situation Alcohol / Substance use:  Never Used Psych involvement (Current and /or in the community):  No (Comment)  Discharge Needs  Concerns to be addressed:  Home Safety Concerns, Basic Needs Readmission within the last 30 days:  No Current discharge risk:  None Barriers to Discharge:  No Barriers Identified   Wetzel Bjornstad, [place] 07/12/2016, 2:57 PM

## 2016-07-12 NOTE — Progress Notes (Signed)
PROGRESS NOTE                                                                                                                                                                                                             Patient Demographics:    Diamond Parsons, is a 56 y.o. female, DOB - 07/12/1960, FAO:[id number]  Admit date - 07/07/2016   Admitting Physician Eduard ClosArshad N Kakrakandy, MD  Outpatient Primary MD for the patient is Patient, No Pcp Per  LOS - 5   No chief complaint on file.      Brief Narrative   56 year old female with past medical history of diabetes mellitus, hypertension, hypothyroidism, anemia, chronic kidney disease, with recent prolonged hospital stay [facility] from 5/16-  6/12, that hospital stay was noted for retired tests and sepsis secondary to cholecystitis status post laparoscopic cholecystectomy, acute arterial ischemia/gangrene of the right foot status post thrombectomy, and transtibial amputation of right foot, AKI on CKD stage III, where she required temporary hemodialysis, she was off hemodialysis on discharge. Patient was admitted for lethargy and abnormal labs at skilled nursing facility, she was started on hemodialysis for hyperkalemia, and history of present illness CKD, as well she was noted to have respiratory failure with hypercapnia where she is requiring continuous BiPAP, AKI has resolved, currently she is off dialysis, as well as respiratory failure has resolved, off BiPAP,she was noted to have diarrhea, C diff + started on oral vancomycin.   Subjective:    Diamond Parsons today Reports she is feeling better today, no chest pain, no palpitation, no shortness of breath, reports R 50 pain is better controlled.   Assessment  & Plan :    Principal Problem:   Acute encephalopathy Active Problems:   Acute on chronic renal failure (HCC)   Leg wound, right   Hypothyroidism   Hyperkalemia   Anemia due to end  stage renal disease (HCC)   Pressure injury of skin   Acute respiratory failure with hypoxia and hypercapnia (HCC)   Acute respiratory failure (HCC)   Acute on chronic alteration in mental status   Acute hypoxic/hypercapnic respiratory failure - This is most likely in the setting OHS, and encephalopathy from uremia. - She did require continuous BiPAP for the first 48 hours, was seen by PCCM, significantly improved, no BiPAP requirement of of  the last 24 hours . - PCCM signed off. - Continue with oxygen and wean as tolerated  - Continue C Pap at bedtime given known OSA  AKI on CK D stage III  - Baseline creatinine around 1.2, new creatinine baseline was 1.8 upon discharge from [facility], she presents with a creatinine of right 5.6 on admission, and hyperkalemia with potassium of 6.9. - Patient was started on hemodialysis per renal, not a long-term dialysis candidate, she has temporary hemodialysis catheter inserted by critical care, required 2 sessions of hemodialysis on admission, renal function recovered, continue to trend down off dialysis , creatinine is 1.2 today, with good urine output , no need for further hemodialysis per renal, they signed off . - We'll request IV team to take him dialysis catheter out fell axis established .  C. difficile diarrhea Continueon oral vancomycin, still with significant diarrhea   Hyperkalemia - Resolved with dialysis  Multiple skin wound/Sacral decubitus ulcer - Continue with wound care, wound care consulted.  status post right foot metatarsal amputation - Secondary to acute arterial ischemia and gangrene of the right foot, status post thrombectomy at [facility]. - Vascular surgery input greatly appreciated, D/W Dr Randie Heinz today, her ABI showing good circulation, so no need for vascular intervention . 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 Ilda Basset to discharge at [facility] regarding further management . - No further need for wound VAC and right groin - Dr. Randie Heinz discussed with vascular surgery at [facility] Dr. Sondra Come , she was on Eliquis for anticoagulation regarding her acute arterial ischemia, where he recommended anticoagulation with warfarin , apparently Xarelto has been studied on patient up to 209 Kg, so it will be more appropriate option than Eliquis , she'll be started on Xarelto for anticoagulation .  Acute Encephalopathy - Appears to be multifactorial, secondary to hypercapnia and uremia Metabolic versus uremic - Significantly improved, passed swallow evaluation  Hypothyroidism - Continue Synthroid, she has elevated TSH, and low free T4, so will increase her Synthroid doseRoom 200-250 g daily   Diabetes mellitus - Continue with insulin sliding scale  Hypertension - blood Pressure is acceptable off medication  OSA -  no need for BiPAP anymore, continue C Pap daily at bedtime .  Code Status : Full  Family Communication  : None at bedside  Disposition Plan  : Back to SNF once stable , 1 diarrhea has improved .  Consults  : Renal , PCCM, vascular surgery  Procedures  : Temporary dialysis catheter insertion(discontinued today )  DVT Prophylaxis  : Will initiate Xarelto today   Lab Results  Component Value Date   PLT 457 (H) 07/12/2016    Antibiotics  :    Anti-infectives    Start     Dose/Rate Route Frequency Ordered Stop   07/10/16 1800  vancomycin (VANCOCIN) 50 mg/mL oral solution 125 mg     125 mg Oral Every 6 hours 07/10/16 1749          Objective:   Vitals:   07/12/16 0000 07/12/16 0438 07/12/16 0749 07/12/16 1202  BP: (!) 149/99 121/72 (!) 150/81 (!) 153/90  Pulse: 71 90 91   Resp: 16 17 15 13   Temp: 97.1 F (36.2 C) 98.8 F (37.1 C) 99 F (37.2 C) 99.1 F (37.3 C)  TempSrc: Oral Oral Oral Oral  SpO2: 100%  98% 98% 99%  Weight:      Height:        Wt  Readings from Last 3 Encounters:  07/09/16 (!) 203.4 kg (448 lb 6.7 oz)     Intake/Output Summary (Last 24 hours) at 07/12/16 1435 Last data filed at 07/12/16 0900  Gross per 24 hour  Intake              120 ml  Output             1800 ml  Net            -1680 ml     Physical Exam  Awake alert oriented 3, appropriate, intact judgment and insight  Distant lung sounds, but couldn't advance the bilaterally, no wheezing, rales or rhonchi, no use of accessory muscle  Regular rate and rhythm, no rubs murmurs gallops  Abdomen obese, soft, nontender, nondistended, bowel sounds present . Abdomen obese, soft, nontender, nondistended, bowel sounds presentNo edema, right foot is bandaged .   Marland Kitchen   Data Review:    CBC  Recent Labs Lab 07/08/16 0128  07/08/16 1102 07/09/16 0314 07/10/16 0456 07/11/16 0239 07/12/16 0246  WBC 10.7*  < > 9.3 9.1 10.7* 10.0 10.6*  HGB 9.2*  < > 9.0* 9.2* 8.8* 8.7* 9.3*  HCT 30.6*  < > 31.3* 31.4* 30.4* 30.1* 31.0*  PLT 620*  < > 610* 555* 470* 479* 457*  MCV 84.3  < > 86.0 86.3 85.9 85.3 84.9  MCH 25.3*  < > 24.7* 25.3* 24.9* 24.6* 25.5*  MCHC 30.1  < > 28.8* 29.3* 28.9* 28.9* 30.0  RDW 22.3*  < > 22.7* 22.8* 22.7* 22.2* 22.1*  LYMPHSABS 1.6  --   --   --   --   --   --   MONOABS 0.6  --   --   --   --   --   --   EOSABS 0.1  --   --   --   --   --   --   BASOSABS 0.1  --   --   --   --   --   --   < > = values in this interval not displayed.  Chemistries   Recent Labs Lab 07/08/16 0128  07/08/16 1102 07/09/16 0110  07/09/16 1840 07/10/16 0030 07/10/16 0456 07/11/16 0239 07/12/16 0246  NA 129*  < > 130* 131*  --   --   --  135 139 140  K 6.9*  < > 6.1* 5.6*  < > 4.6 5.8* 4.5 4.1 3.9  CL 92*  < > 93* 94*  --   --   --  98* 102 103  CO2 22  < > 24 23  --   --   --  26 28 26   GLUCOSE 106*  < > 96 97  --   --   --  79 83 108*  BUN 87*  < > 90* 61*  --   --   --  46* 38* 28*  CREATININE 5.61*  < > 5.37* 3.96*  --   --   --  2.34* 1.67*  1.25*  CALCIUM 6.9*  < > 6.8* 7.0*  --   --   --  6.7* 6.8* 6.9*  AST 26  --   --   --   --   --   --   --   --   --   ALT 10*  --   --   --   --   --   --   --   --   --  ALKPHOS 84  --   --   --   --   --   --   --   --   --   BILITOT 0.4  --   --   --   --   --   --   --   --   --   < > = values in this interval not displayed. ------------------------------------------------------------------------------------------------------------------ No results for input(s): CHOL, HDL, LDLCALC, TRIG, CHOLHDL, LDLDIRECT in the last 72 hours.  No results found for: HGBA1C ------------------------------------------------------------------------------------------------------------------ No results for input(s): TSH, T4TOTAL, T3FREE, THYROIDAB in the last 72 hours.  Invalid input(s): FREET3 ------------------------------------------------------------------------------------------------------------------ No results for input(s): VITAMINB12, FOLATE, FERRITIN, TIBC, IRON, RETICCTPCT in the last 72 hours.  Coagulation profile No results for input(s): INR, PROTIME in the last 168 hours.  No results for input(s): DDIMER in the last 72 hours.  Cardiac Enzymes No results for input(s): CKMB, TROPONINI, MYOGLOBIN in the last 168 hours.  Invalid input(s): CK ------------------------------------------------------------------------------------------------------------------    Component Value Date/Time   BNP 76.1 07/08/2016 0753    Inpatient Medications  Scheduled Meds: . chlorhexidine  15 mL Mouth Rinse BID  . feeding supplement (PRO-STAT SUGAR FREE 64)  30 mL Oral TID BM  . heparin  5,000 Units Subcutaneous Q8H  . insulin aspart  0-9 Units Subcutaneous Q4H  . [START ON 07/13/2016] levothyroxine  200 mcg Oral QAC breakfast  . mouth rinse  15 mL Mouth Rinse q12n4p  . vancomycin  125 mg Oral Q6H   Continuous Infusions: . sodium chloride    . sodium chloride     PRN Meds:.sodium chloride, sodium  chloride, acetaminophen **OR** acetaminophen, alteplase, heparin, lidocaine (PF), lidocaine-prilocaine, ondansetron **OR** ondansetron (ZOFRAN) IV, pentafluoroprop-tetrafluoroeth  Micro Results Recent Results (from the past 240 hour(s))  MRSA PCR Screening     Status: None   Collection Time: 07/08/16  4:18 AM  Result Value Ref Range Status   MRSA by PCR NEGATIVE NEGATIVE Final    Comment:        The GeneXpert MRSA Assay (FDA approved for NASAL specimens only), is one component of a comprehensive MRSA colonization surveillance program. It is not intended to diagnose MRSA infection nor to guide or monitor treatment for MRSA infections.   C difficile quick scan w PCR reflex     Status: Abnormal   Collection Time: 07/10/16  2:35 PM  Result Value Ref Range Status   C Diff antigen POSITIVE (A) NEGATIVE Final   C Diff toxin POSITIVE (A) NEGATIVE Final   C Diff interpretation Toxin producing C. difficile detected.  Final    Comment: CRITICAL RESULT CALLED TO, READ BACK BY AND VERIFIED WITH: C WHITESIDE,RN AT 1743 07/10/16 BY L BENFIELD     Radiology Reports Ct Head Wo Contrast  Result Date: 07/09/2016 CLINICAL DATA:  Initial evaluation for acute altered mental status. EXAM: CT HEAD WITHOUT CONTRAST TECHNIQUE: Contiguous axial images were obtained from the base of the skull through the vertex without intravenous contrast. COMPARISON:  Prior CT from 06/03/2016. FINDINGS: Brain: Study limited by patient positioning and extensive motion artifact. Cerebral volume within normal limits. No obvious acute intracranial hemorrhage. Gray-white matter differentiation grossly maintained without definite evidence for acute large vessel territory infarct. No mass lesion, midline shift or mass effect. No hydrocephalus. No appreciable extra-axial fluid collection on this severely motion degraded exam. Vascular: No hyperdense vessel. Skull: Scalp soft tissues within normal limits. Calvarium grossly intact.  Sinuses/Orbits: Globes and orbital soft tissues within normal limits.  Paranasal sinuses are clear. No mastoid effusion. IMPRESSION: The Severely motion degraded exam. No obvious acute intracranial process identified. Electronically Signed   By: Rise Mu M.D.   On: 07/09/2016 04:54   Dg Chest Port 1 View  Result Date: 07/10/2016 CLINICAL DATA:  Respiratory failure as well as chronic renal failure. EXAM: PORTABLE CHEST 1 VIEW COMPARISON:  07/08/2016 FINDINGS: Right IJ central venous catheter unchanged with tip over the SVC. Lungs are somewhat hypoinflated with mild stable prominence of the perihilar markings likely mild vascular congestion. No lobar consolidation or effusion. Stable cardiomegaly. Remainder of the exam is unchanged. IMPRESSION: Stable cardiomegaly with mild stable vascular congestion. Right IJ central venous catheter unchanged. Electronically Signed   By: Elberta Fortis M.D.   On: 07/10/2016 08:22   Dg Chest Port 1 View  Result Date: 07/08/2016 CLINICAL DATA:  Encounter for central line placement. EXAM: PORTABLE CHEST 1 VIEW COMPARISON:  Radiograph of same day. FINDINGS: Stable cardiomegaly with central pulmonary vascular congestion. No pneumothorax or pleural effusion is noted. Interval placement of right internal jugular catheter with distal tip in expected position of SVC. Bony thorax is unremarkable. IMPRESSION: Stable cardiomegaly with central pulmonary vascular congestion. Interval placement of right internal jugular catheter with distal tip in expected position of SVC. Electronically Signed   By: Lupita Raider, M.D.   On: 07/08/2016 13:16   Dg Chest Port 1 View  Result Date: 07/08/2016 CLINICAL DATA:  Hypoxia EXAM: PORTABLE CHEST 1 VIEW COMPARISON:  None. FINDINGS: There is no edema or consolidation. There is cardiomegaly with pulmonary venous hypertension. No adenopathy. Moderate air is noted in the upper esophagus. Visualized trachea appears unremarkable. No bone  lesions. IMPRESSION: Evidence pulmonary vascular congestion without edema or consolidation. Air in upper esophagus of uncertain etiology. Electronically Signed   By: Bretta Bang III M.D.   On: 07/08/2016 08:11     Edie Darley M.D on 07/12/2016 at 2:35 PM  Between 7am to 7pm - Pager - [phone]  After 7pm go to [email or web address] - password [facility]  [facility] -  Office  [phone]

## 2016-07-12 NOTE — Progress Notes (Signed)
Patient does not wish to wear BiPAP tonight. RT will continue to monitor.

## 2016-07-12 NOTE — Progress Notes (Signed)
Nutrition Follow-up  DOCUMENTATION CODES:   Morbid obesity  INTERVENTION:   -30 ml Prostat TID, each supplement provides 100 kcals and 15 grams protein  NUTRITION DIAGNOSIS:   Increased nutrient needs related to wound healing as evidenced by estimated needs.  Ongoing  GOAL:   Patient will meet greater than or equal to 90% of their needs  Progressing  MONITOR:   PO intake, Supplement acceptance, Labs, Weight trends, Skin, I & O's  REASON FOR ASSESSMENT:   Low Braden    ASSESSMENT:   Diamond Parsons is a 56 y.o. female with history of diabetes mellitus, hypertension, hypothyroidism anemia and chronic kidney disease who was admitted last month for right foot infection underwent amputation and is on wound VAC and during that admission as per the reports patient had a couple of sessions dialysis and was eventually discharged to rehabilitation. Patient's routine lab work showed patient has hyperkalemia and was [facility].   6/21- HD cath placed 6/23-s/p BSE- recommended regular diet with thin liquids  Reviewed CWOCN note from 07/08/16; pt with necrotic area on rt transmetatarsal amputation. Pt also with several partial thickness wounds on perineum, buttocks, rt groin, and lt medial thigh. Per VVS note on 07/11/16, plan to continue dry dressing on groin and allow foot wound to demarcate.   Pt very sleepy at time of visit. Spoke with nurse tech, who reports limited intake in the setting of nausea, vomiting, and diarrhea. Rectal tube placed on 07/10/16. Observed breakfast tray at bedside, which was unattempted. Noted meal completion 10%.  Nephrology continues to follow. Pt required  HD on 07/08/16 and 07/09/16.   Reviewed records from SNF; [facility]   Labs reviewed: CBGS: 80-115.  Diet Order:  Diet Carb Modified Fluid consistency: Thin; Room service appropriate? Yes  Skin:  Wound (see comment) (multiple wounds)  Last BM:  07/12/16 (400  ml via rectal tube)  Height:   Ht Readings from Last 1 Encounters:  07/08/16 5\' 4"  (1.626 m)    Weight:   Wt Readings from Last 1 Encounters:  07/09/16 (!) 448 lb 6.7 oz (203.4 kg)    Ideal Body Weight:  54.5 kg  BMI:  Body mass index is 76.97 kg/m.  Estimated Nutritional Needs:   Kcal:  [id number]  Protein:  > 136 grams  Fluid:  > 1.2 L  EDUCATION NEEDS:   Education needs no appropriate at this time  Jameria Bradway A. Mayford KnifeWilliams, RD, LDN, CDE Pager: [phone] After hours Pager: [phone]

## 2016-07-13 DIAGNOSIS — G4733 Obstructive sleep apnea (adult) (pediatric): Secondary | ICD-10-CM

## 2016-07-13 DIAGNOSIS — L8990 Pressure ulcer of unspecified site, unspecified stage: Secondary | ICD-10-CM

## 2016-07-13 DIAGNOSIS — E039 Hypothyroidism, unspecified: Secondary | ICD-10-CM

## 2016-07-13 DIAGNOSIS — S81801D Unspecified open wound, right lower leg, subsequent encounter: Secondary | ICD-10-CM

## 2016-07-13 DIAGNOSIS — R4182 Altered mental status, unspecified: Secondary | ICD-10-CM

## 2016-07-13 DIAGNOSIS — A0472 Enterocolitis due to Clostridium difficile, not specified as recurrent: Secondary | ICD-10-CM

## 2016-07-13 DIAGNOSIS — R0902 Hypoxemia: Secondary | ICD-10-CM

## 2016-07-13 LAB — CBC
HCT: 31.1 % — ABNORMAL LOW (ref 36.0–46.0)
Hemoglobin: 9 g/dL — ABNORMAL LOW (ref 12.0–15.0)
MCH: 24.7 pg — AB (ref 26.0–34.0)
MCHC: 28.9 g/dL — ABNORMAL LOW (ref 30.0–36.0)
MCV: 85.2 fL (ref 78.0–100.0)
PLATELETS: 399 10*3/uL (ref 150–400)
RBC: 3.65 MIL/uL — AB (ref 3.87–5.11)
RDW: 21.9 % — ABNORMAL HIGH (ref 11.5–15.5)
WBC: 13.6 10*3/uL — AB (ref 4.0–10.5)

## 2016-07-13 LAB — BASIC METABOLIC PANEL
Anion gap: 12 (ref 5–15)
BUN: 20 mg/dL (ref 6–20)
CO2: 26 mmol/L (ref 22–32)
CREATININE: 1.33 mg/dL — AB (ref 0.44–1.00)
Calcium: 6.9 mg/dL — ABNORMAL LOW (ref 8.9–10.3)
Chloride: 103 mmol/L (ref 101–111)
GFR, EST AFRICAN AMERICAN: 51 mL/min — AB (ref >60)
GFR, EST NON AFRICAN AMERICAN: 44 mL/min — AB (ref >60)
Glucose, Bld: 95 mg/dL (ref 65–99)
POTASSIUM: 3.8 mmol/L (ref 3.5–5.1)
SODIUM: 141 mmol/L (ref 135–145)

## 2016-07-13 LAB — GLUCOSE, CAPILLARY
GLUCOSE-CAPILLARY: 108 mg/dL — AB (ref 65–99)
GLUCOSE-CAPILLARY: 89 mg/dL (ref 65–99)
GLUCOSE-CAPILLARY: 90 mg/dL (ref 65–99)
GLUCOSE-CAPILLARY: 93 mg/dL (ref 65–99)
Glucose-Capillary: 100 mg/dL — ABNORMAL HIGH (ref 65–99)
Glucose-Capillary: 115 mg/dL — ABNORMAL HIGH (ref 65–99)

## 2016-07-13 MED ORDER — HYDRALAZINE HCL 20 MG/ML IJ SOLN: 10.0000 mg | Freq: Four times a day (QID) | INTRAMUSCULAR | Status: DC | PRN

## 2016-07-13 NOTE — Evaluation (Signed)
Physical Therapy Evaluation Patient Details Name: Diamond Parsons MRN: [id number] DOB: [birth year] Today's Date: 07/13/2016   History of Present Illness  Pt adm from SNF with acute hypoxic resp failure requiring bipap and AKI requiring HD. Pt with acute encephalopathy. Pt also found to have C-diff. PMH - rt transmetatarsal amputation recently, morbid obesity, DM, ckd,   Clinical Impression  Pt admitted with above diagnosis and presents to PT with functional limitations due to deficits listed below (See PT problem list). Pt needs skilled PT to maximize independence and safety to allow discharge to SNF. Expect pt may be able to stand with two person assist if weight bearing not restricted on RLE. Pt motivated to mobilize and maximize her independence.     Follow Up Recommendations SNF    Equipment Recommendations  Other (comment) (To be assessed at next venue)    Recommendations for Other Services       Precautions / Restrictions Precautions Precautions: Fall Restrictions Other Position/Activity Restrictions: Unsure of weight bearing restrictions on RLE due to amputation      Mobility  Bed Mobility Overal bed mobility: Needs Assistance Bed Mobility: Supine to Sit;Sit to Sidelying     Supine to sit: Min assist   Sit to sidelying: Min assist General bed mobility comments: Assist for pt to pull trunk up into sitting. Assist to bring legs back up into bed returning to sidelying  Transfers                 General transfer comment: Did not attempt with one person assist  Ambulation/Gait                Stairs            Wheelchair Mobility    Modified Rankin (Stroke Patients Only)       Balance Overall balance assessment: Needs assistance Sitting-balance support: No upper extremity supported;Feet supported Sitting balance-Leahy Scale: Good Sitting balance - Comments: Pt sat EOB x 15 minutes with supervision. VSS                                      Pertinent Vitals/Pain Pain Assessment: Faces Faces Pain Scale: Hurts a little bit Pain Location: rt foot when sitting EOB Pain Descriptors / Indicators: Throbbing Pain Intervention(s): Limited activity within patient's tolerance    Home Living Family/patient expects to be discharged to:: Skilled nursing facility                      Prior Function Level of Independence: Needs assistance   Gait / Transfers Assistance Needed: Difficult to assess due to pt confusion. Pt stated she was transferring to chair at snf with walker but later said they were using a lift and then again said she was using walker. Pt also stated she wasn't allowed weight on her rt foot but then stated she was standing on it. Pt did state she was ambulating modified independent with a cane prior to foot amputation           Hand Dominance        Extremity/Trunk Assessment   Upper Extremity Assessment Upper Extremity Assessment: Defer to OT evaluation    Lower Extremity Assessment Lower Extremity Assessment: RLE deficits/detail;Generalized weakness RLE Deficits / Details: rt transmetatarsal amputation       Communication   Communication: No difficulties  Cognition Arousal/Alertness: Awake/alert Behavior During Therapy: [facility] for  tasks assessed/performed Overall Cognitive Status: Impaired/Different from baseline Area of Impairment: Orientation;Memory;Following commands;Safety/judgement;Problem solving                 Orientation Level: Disoriented to;Situation;Time   Memory: Decreased short-term memory Following Commands: Follows one step commands consistently Safety/Judgement: Decreased awareness of deficits   Problem Solving: Slow processing;Decreased initiation;Requires verbal cues;Requires tactile cues        General Comments      Exercises     Assessment/Plan    PT Assessment Patient needs continued PT services  PT Problem List Decreased strength;Decreased  activity tolerance;Decreased balance;Decreased mobility;Decreased cognition;Decreased knowledge of use of DME;Obesity       PT Treatment Interventions DME instruction;Gait training;Functional mobility training;Therapeutic activities;Therapeutic exercise;Balance training;Cognitive remediation;Patient/family education    PT Goals (Current goals can be found in the Care Plan section)  Acute Rehab PT Goals Patient Stated Goal: return home PT Goal Formulation: With patient Time For Goal Achievement: 07/27/16 Potential to Achieve Goals: Good    Frequency Min 3X/week   Barriers to discharge        Co-evaluation               AM-PAC PT "6 Clicks" Daily Activity  Outcome Measure Difficulty turning over in bed (including adjusting bedclothes, sheets and blankets)?: Total Difficulty moving from lying on back to sitting on the side of the bed? : Total Difficulty sitting down on and standing up from a chair with arms (e.g., wheelchair, bedside commode, etc,.)?: Total Help needed moving to and from a bed to chair (including a wheelchair)?: Total Help needed walking in hospital room?: Total Help needed climbing 3-5 steps with a railing? : Total 6 Click Score: 6    End of Session   Activity Tolerance: Patient tolerated treatment well Patient left: in bed;with call bell/phone within reach Nurse Communication: Mobility status PT Visit Diagnosis: Unsteadiness on feet (R26.81);Muscle weakness (generalized) (M62.81);Difficulty in walking, not elsewhere classified (R26.2)    Time: [id number] PT Time Calculation (min) (ACUTE ONLY): 33 min   Charges:   PT Evaluation $PT Eval Moderate Complexity: 1 Procedure PT Treatments $Therapeutic Activity: 8-22 mins   PT G Codes:        [facility] Maranto PT [phone]   Angelina OkCary W [facility] 07/13/2016, 3:28 PM

## 2016-07-13 NOTE — [place] FL2 (Signed)
[place] MEDICAID FL2 LEVEL OF CARE SCREENING TOOL     IDENTIFICATION  Patient Name: Diamond Parsons Birthdate: 11/07/1960 Sex: female Admission Date (Current Location): 07/07/2016  [facility] [place] [place]   Facility and Address:  [address], [address], [place], [place] [address]      Provider Number: [id number]  Attending Physician Name and Address:  Edsel PetrinMikhail, Maryann, DO  Relative Name and Phone Number:  [name], [phone], Stacy Gardnerjamal Taves [phone]    Current Level of Care: Hospital Recommended Level of Care: Skilled Nursing Facility Prior Approval Number:    Date Approved/Denied:   PASRR Number: [id number] [id number]  Discharge Plan: SNF    Current Diagnoses: Patient Active Problem List   Diagnosis Date Noted  . Acute respiratory failure with hypoxia and hypercapnia (HCC) 07/10/2016  . Acute respiratory failure (HCC)   . Acute on chronic alteration in mental status   . Pressure injury of skin 07/09/2016  . Acute encephalopathy 07/08/2016  . Acute on chronic renal failure (HCC) 07/08/2016  . Leg wound, right 07/08/2016  . Hypothyroidism 07/08/2016  . Hyperkalemia 07/08/2016  . Anemia due to end stage renal disease (HCC) 07/08/2016    Orientation RESPIRATION BLADDER Height & Weight     Self, Time, Situation, Place  O2 Continent Weight: (!) 448 lb 6.7 oz (203.4 kg) Height:  5\' 4"  (162.6 cm) (from 06/25/16 encounter)  BEHAVIORAL SYMPTOMS/MOOD NEUROLOGICAL BOWEL NUTRITION STATUS      Incontinent Diet (carb modified: fluid consistency: thin)  AMBULATORY STATUS COMMUNICATION OF NEEDS Skin     Verbally Normal                       Personal Care Assistance Level of Assistance  Bathing, Feeding, Dressing Bathing Assistance: Limited assistance Feeding assistance: Independent Dressing Assistance: Maximum assistance     Functional Limitations Info  Sight, Hearing, Speech Sight Info: Adequate Hearing Info:  Adequate Speech Info: Adequate    SPECIAL CARE FACTORS FREQUENCY                       Contractures Contractures Info: Not present    Additional Factors Info  Code Status Code Status Info: Full Code             Current Medications (07/13/2016):  This is the current hospital active medication list Current Facility-Administered Medications  Medication Dose Route Frequency Provider Last Rate Last Dose  . 0.9 %  sodium chloride infusion  100 mL Intravenous PRN Bufford ButtnerUpton, Elizabeth, MD      . 0.9 %  sodium chloride infusion  100 mL Intravenous PRN Bufford ButtnerUpton, Elizabeth, MD      . acetaminophen (TYLENOL) tablet 650 mg  650 mg Oral Q6H PRN Eduard ClosKakrakandy, Arshad N, MD   650 mg at 07/12/16 0107   Or  . acetaminophen (TYLENOL) suppository 650 mg  650 mg Rectal Q6H PRN Eduard ClosKakrakandy, Arshad N, MD      . alteplase (CATHFLO ACTIVASE) injection 2 mg  2 mg Intracatheter Once PRN Bufford ButtnerUpton, Elizabeth, MD      . busPIRone (BUSPAR) tablet 5 mg  5 mg Oral TID Elgergawy, Leana Roeawood S, MD   5 mg at 07/13/16 1103  . chlorhexidine (PERIDEX) 0.12 % solution 15 mL  15 mL Mouth Rinse BID Elgergawy, Leana Roeawood S, MD   15 mL at 07/13/16 1103  . feeding supplement (PRO-STAT SUGAR FREE 64) liquid 30 mL  30 mL Oral TID BM Elgergawy, Mliss Fritzawood  S, MD   30 mL at 07/12/16 1359  . heparin injection 1,000 Units  1,000 Units Dialysis PRN Bufford Buttner, MD      . hydrALAZINE (APRESOLINE) injection 10 mg  10 mg Intravenous Q6H PRN Mikhail, Nita Sells, DO      . insulin aspart (novoLOG) injection 0-9 Units  0-9 Units Subcutaneous Q4H Eduard Clos, MD      . levothyroxine (SYNTHROID, LEVOTHROID) tablet 250 mcg  250 mcg Oral QAC breakfast Elgergawy, Leana Roe, MD   250 mcg at 07/13/16 1103  . lidocaine (PF) (XYLOCAINE) 1 % injection 5 mL  5 mL Intradermal PRN Bufford Buttner, MD      . lidocaine-prilocaine (EMLA) cream 1 application  1 application Topical PRN Bufford Buttner, MD      . MEDLINE mouth rinse  15 mL Mouth Rinse q12n4p  Elgergawy, Leana Roe, MD   15 mL at 07/12/16 1200  . ondansetron (ZOFRAN) tablet 4 mg  4 mg Oral Q6H PRN Eduard Clos, MD   4 mg at 07/13/16 1329   Or  . ondansetron (ZOFRAN) injection 4 mg  4 mg Intravenous Q6H PRN Eduard Clos, MD   4 mg at 07/13/16 1191  . oxyCODONE-acetaminophen (PERCOCET/ROXICET) 5-325 MG per tablet 1 tablet  1 tablet Oral Q4H PRN Elgergawy, Leana Roe, MD      . pentafluoroprop-tetrafluoroeth (GEBAUERS) aerosol 1 application  1 application Topical PRN Bufford Buttner, MD      . rivaroxaban (XARELTO) tablet 20 mg  20 mg Oral Q supper Elgergawy, Leana Roe, MD      . sertraline (ZOLOFT) tablet 100 mg  100 mg Oral QHS Elgergawy, Leana Roe, MD   100 mg at 07/12/16 2106  . vancomycin (VANCOCIN) 50 mg/mL oral solution 125 mg  125 mg Oral Q6H Elgergawy, Leana Roe, MD   125 mg at 07/13/16 1330  . [START ON 07/16/2016] Vitamin D (Ergocalciferol) (DRISDOL) capsule 50,000 Units  50,000 Units Oral Q7 days Elgergawy, Leana Roe, MD         Discharge Medications: Please see discharge summary for a list of discharge medications.  Relevant Imaging Results:  Relevant Lab Results:   Additional Information SS#[id number]  Althea Charon, LCSW

## 2016-07-13 NOTE — Progress Notes (Addendum)
PROGRESS NOTE    Diamond Parsons  WJX:[id number] DOB: 10-21-1960 DOA: 07/07/2016 PCP: Patient, No Pcp Per   No chief complaint on file.   Brief Narrative:  HPI On 07/08/2016 by Dr. Midge Minium Diamond Parsons is a 56 y.o. female with history of diabetes mellitus, hypertension, hypothyroidism anemia and chronic kidney disease who was admitted last month for right foot infection underwent amputation and is on wound VAC and during that admission as per the reports patient had a couple of sessions dialysis and was eventually discharged to rehabilitation. Patient's routine lab work showed patient has hyperkalemia and was transferred to [facility].   Interim history Admitted for lethargy and abnormal labs from SNF. Started on hemodialysis for hyperkalemia. Also noted to have respiratory failure with hypercapnia and required BiPAP. AKI has improved. Now has Cdiff and on oral Vancomycin.  Assessment & Plan   Acute hypoxic/hypercapnic respiratory failure -Likely in the setting of oh at bedtime, encephalopathy from uremia -Patient did require continuous BiPAP and was seen by PCCM -Patient has since been weaned off of BiPAP and currently not needing supplemental oxygen  Acute kidney injury on chronic kidney disease, stage III complicated by hyperkalemia -Nephrology consult and appreciated -Based and creatinine approximate 1.2 -Creatinine upon admission was 5.6 -Patient required hemodialysis, 2 sessions -Potassium and creatinine improved, patient has good urinary output -Nephrology has signed off  C. difficile diarrhea -C. difficile antigen and toxin positive -Continue vancomycin -Patient currently has floxacillin place. Per nursing, output has improved mildly -Discussed with infectious disease, Dr. Drue Second, via phone. Would continue oral vancomycin for now, would not use Questran. May consider deficid if no improvement in diarrhea  Acute encephalopathy, metabolic -Appears to be back  to her baseline -Likely secondary to multifactorial causes including hypercapnia/hypoxia/uremia  Right metatarsal amputation  -Secondary to acute arterial ischemia and gangrene of the right foot, status post thrombectomy at [facility] -Previous hospitalist discussed with vascular surgery, Dr. Randie Heinz. ABI showed good circulation and transmetatarsal amputation site shows no dehiscence, no need for vascular or surgical intervention at this time. -Per previous hospitalist, no need for wound VAC. -Dr. Pascal Lux discussed with vascular surgeon at [facility], Dr. Sondra Come. Patient was on Eliquis for anticoagulation given her acute arterial ischemia. Xarelto has been saying in patients up to 209 kg, which may be more appropriate for this patient. Xarelto started on 6/25 -Patient will need to follow-up with her primary surgeons upon discharge -PT and OT consulted  -Likely needs SNF  Multiple skin wounds/sacral decubitus ulcer -Wound care consulted  Obstructive sleep apnea -Continue CPAP QHS  Diabetes mellitus, type II -Continue insulin sliding scale and CBG monitoring  Essential hypertension -Will place on IV hydralazine as needed  Hypothyroidism -Continue Synthroid -Patient had elevated TSH, low free T4, Synthroid was increased -Patient will need repeat labs in approximate 4 weeks  Depression -Continue BuSpar, Zoloft  Normocytic Anemia -Unknown baseline hemoglobin, currently 9.0 -Suspect secondary to chronic kidney disease -Continue to monitor CBC  DVT Prophylaxis  Xarelto  Code Status: Full  Family Communication: None at bedside  Disposition Plan: Admitted. Continue to monitor in stepdown. Pending SNF placement when diarrhea improves.   Consultants PCCM Vascular surgery Nephrology  Infectious disease via phone, Dr. Drue Second  Procedures  Insertion of temporary dialysis catheter  Antibiotics   Anti-infectives    Start     Dose/Rate Route Frequency Ordered  Stop   07/10/16 1800  vancomycin (VANCOCIN) 50 mg/mL oral solution 125 mg  125 mg Oral Every 6 hours 07/10/16 1749        Subjective:   Diamond Parsons seen and examined today.  Patient denies any chest pain, short of breath, bone pain, nausea vomiting, headache or dizziness. Does not like using CPAP. Feels diarrhea has improved mildly.  Objective:   Vitals:   07/13/16 0004 07/13/16 0345 07/13/16 0700 07/13/16 1100  BP: (!) 152/92 137/71 (!) 149/94 (!) 125/110  Pulse: 92 90 97 81  Resp: 18 14    Temp: 98.8 F (37.1 C) 98.9 F (37.2 C) 98.6 F (37 C) 97.8 F (36.6 C)  TempSrc: Oral Oral Oral Oral  SpO2: 94% 95% 98%   Weight:      Height:        Intake/Output Summary (Last 24 hours) at 07/13/16 1218 Last data filed at 07/13/16 0616  Gross per 24 hour  Intake                0 ml  Output             1825 ml  Net            -1825 ml   Filed Weights   07/09/16 0030 07/09/16 1350 07/09/16 1645  Weight: (!) 201 kg (443 lb 2 oz) (!) 204.4 kg (450 lb 9.9 oz) (!) 203.4 kg (448 lb 6.7 oz)    Exam  General: Well developed, well nourished, NAD, appears stated age  HEENT: NCAT, mucous membranes moist.   Cardiovascular: S1 S2 auscultated, no rubs, murmurs or gallops. Regular rate and rhythm.  Respiratory: Clear to auscultation bilaterally with equal chest rise  Abdomen: Soft, obese, nontender, nondistended, + bowel sounds  Extremities: warm dry without cyanosis clubbing or edema. Right foot with dressing in place. B/L feet in prevnar boots.   Neuro: AAOx3, nonfocal   Psych: Appropriate mood and affect  Data Reviewed: I have personally reviewed following labs and imaging studies  CBC:  Recent Labs Lab 07/08/16 0128  07/09/16 0314 07/10/16 0456 07/11/16 0239 07/12/16 0246 07/13/16 0622  WBC 10.7*  < > 9.1 10.7* 10.0 10.6* 13.6*  NEUTROABS 8.3*  --   --   --   --   --   --   HGB 9.2*  < > 9.2* 8.8* 8.7* 9.3* 9.0*  HCT 30.6*  < > 31.4* 30.4* 30.1* 31.0* 31.1*    MCV 84.3  < > 86.3 85.9 85.3 84.9 85.2  PLT 620*  < > 555* 470* 479* 457* 399  < > = values in this interval not displayed. Basic Metabolic Panel:  Recent Labs Lab 07/08/16 1102 07/09/16 0110  07/10/16 0030 07/10/16 0456 07/11/16 0239 07/12/16 0246 07/13/16 0622  NA 130* 131*  --   --  135 139 140 141  K 6.1* 5.6*  < > 5.8* 4.5 4.1 3.9 3.8  CL 93* 94*  --   --  98* 102 103 103  CO2 24 23  --   --  26 28 26 26   GLUCOSE 96 97  --   --  79 83 108* 95  BUN 90* 61*  --   --  46* 38* 28* 20  CREATININE 5.37* 3.96*  --   --  2.34* 1.67* 1.25* 1.33*  CALCIUM 6.8* 7.0*  --   --  6.7* 6.8* 6.9* 6.9*  PHOS 10.0*  --   --   --   --   --   --   --   < > =  values in this interval not displayed. GFR: Estimated Creatinine Clearance: 86.2 mL/min (A) (by C-G formula based on SCr of 1.33 mg/dL (H)). Liver Function Tests:  Recent Labs Lab 07/08/16 0128 07/08/16 1102  AST 26  --   ALT 10*  --   ALKPHOS 84  --   BILITOT 0.4  --   PROT 7.5  --   ALBUMIN 2.4* 2.4*    Recent Labs Lab 07/10/16 0456  LIPASE 67*    Recent Labs Lab 07/08/16 0753  AMMONIA 29   Coagulation Profile: No results for input(s): INR, PROTIME in the last 168 hours. Cardiac Enzymes:  Recent Labs Lab 07/11/16 0239  CKTOTAL 230   BNP (last 3 results) No results for input(s): PROBNP in the last 8760 hours. HbA1C: No results for input(s): HGBA1C in the last 72 hours. CBG:  Recent Labs Lab 07/12/16 2011 07/13/16 0010 07/13/16 0352 07/13/16 0829 07/13/16 1208  GLUCAP 96 89 100* 93 108*   Lipid Profile: No results for input(s): CHOL, HDL, LDLCALC, TRIG, CHOLHDL, LDLDIRECT in the last 72 hours. Thyroid Function Tests: No results for input(s): TSH, T4TOTAL, FREET4, T3FREE, THYROIDAB in the last 72 hours. Anemia Panel: No results for input(s): VITAMINB12, FOLATE, FERRITIN, TIBC, IRON, RETICCTPCT in the last 72 hours. Urine analysis: No results found for: COLORURINE, APPEARANCEUR, LABSPEC, PHURINE,  GLUCOSEU, HGBUR, BILIRUBINUR, KETONESUR, PROTEINUR, UROBILINOGEN, NITRITE, LEUKOCYTESUR Sepsis Labs: @LABRCNTIP (procalcitonin:4,lacticidven:4)  ) Recent Results (from the past 240 hour(s))  MRSA PCR Screening     Status: None   Collection Time: 07/08/16  4:18 AM  Result Value Ref Range Status   MRSA by PCR NEGATIVE NEGATIVE Final    Comment:        The GeneXpert MRSA Assay (FDA approved for NASAL specimens only), is one component of a comprehensive MRSA colonization surveillance program. It is not intended to diagnose MRSA infection nor to guide or monitor treatment for MRSA infections.   C difficile quick scan w PCR reflex     Status: Abnormal   Collection Time: 07/10/16  2:35 PM  Result Value Ref Range Status   C Diff antigen POSITIVE (A) NEGATIVE Final   C Diff toxin POSITIVE (A) NEGATIVE Final   C Diff interpretation Toxin producing C. difficile detected.  Final    Comment: CRITICAL RESULT CALLED TO, READ BACK BY AND VERIFIED WITH: C WHITESIDE,RN AT 1743 07/10/16 BY L BENFIELD       Radiology Studies: Dg Abd Portable 1v  Result Date: 07/12/2016 CLINICAL DATA:  56 year old female with vomiting. Initial encounter. EXAM: PORTABLE ABDOMEN - 1 VIEW COMPARISON:  06/04/2016 CT. FINDINGS: Gas-filled slightly prominent size stomach Small bowel loop central abdomen with slightly thickened folds. Etiology/significance indeterminate. No other bowel abnormality noted. Post cholecystectomy. IMPRESSION: Gas-filled slightly prominent size stomach. Small bowel loop central abdomen with slightly thickened folds. Etiology/significance indeterminate. Electronically Signed   By: Lacy DuverneySteven  Olson M.D.   On: 07/12/2016 18:59     Scheduled Meds: . busPIRone  5 mg Oral TID  . chlorhexidine  15 mL Mouth Rinse BID  . feeding supplement (PRO-STAT SUGAR FREE 64)  30 mL Oral TID BM  . insulin aspart  0-9 Units Subcutaneous Q4H  . levothyroxine  250 mcg Oral QAC breakfast  . mouth rinse  15 mL Mouth  Rinse q12n4p  . rivaroxaban  20 mg Oral Q supper  . sertraline  100 mg Oral QHS  . vancomycin  125 mg Oral Q6H  . [START ON 07/16/2016] Vitamin D (Ergocalciferol)  50,000  Units Oral Q7 days   Continuous Infusions: . sodium chloride    . sodium chloride       LOS: 6 days   Time Spent in minutes   35 minutes  Diamond Parsons D.O. on 07/13/2016 at 12:18 PM  Between 7am to 7pm - Pager - [phone]  After 7pm go to [email or web address] - password [email or web address]  And look for the night coverage person covering for me after hours  [facility] Office  [phone]

## 2016-07-14 ENCOUNTER — Inpatient Hospital Stay (HOSPITAL_COMMUNITY): Payer: Medicare HMO

## 2016-07-14 DIAGNOSIS — D72829 Elevated white blood cell count, unspecified: Secondary | ICD-10-CM

## 2016-07-14 DIAGNOSIS — D649 Anemia, unspecified: Secondary | ICD-10-CM

## 2016-07-14 LAB — CBC
HEMATOCRIT: 31.9 % — AB (ref 36.0–46.0)
HEMOGLOBIN: 9.3 g/dL — AB (ref 12.0–15.0)
MCH: 24.7 pg — AB (ref 26.0–34.0)
MCHC: 29.2 g/dL — ABNORMAL LOW (ref 30.0–36.0)
MCV: 84.8 fL (ref 78.0–100.0)
Platelets: 355 10*3/uL (ref 150–400)
RBC: 3.76 MIL/uL — AB (ref 3.87–5.11)
RDW: 21.6 % — ABNORMAL HIGH (ref 11.5–15.5)
WBC: 14.8 10*3/uL — ABNORMAL HIGH (ref 4.0–10.5)

## 2016-07-14 LAB — GLUCOSE, CAPILLARY
GLUCOSE-CAPILLARY: 92 mg/dL (ref 65–99)
GLUCOSE-CAPILLARY: 118 mg/dL — AB (ref 65–99)
Glucose-Capillary: 105 mg/dL — ABNORMAL HIGH (ref 65–99)
Glucose-Capillary: 107 mg/dL — ABNORMAL HIGH (ref 65–99)
Glucose-Capillary: 112 mg/dL — ABNORMAL HIGH (ref 65–99)
Glucose-Capillary: 94 mg/dL (ref 65–99)

## 2016-07-14 LAB — BASIC METABOLIC PANEL
Anion gap: 7 (ref 5–15)
BUN: 18 mg/dL (ref 6–20)
CHLORIDE: 102 mmol/L (ref 101–111)
CO2: 29 mmol/L (ref 22–32)
CREATININE: 1.37 mg/dL — AB (ref 0.44–1.00)
Calcium: 6.8 mg/dL — ABNORMAL LOW (ref 8.9–10.3)
GFR calc non Af Amer: 43 mL/min — ABNORMAL LOW (ref >60)
GFR, EST AFRICAN AMERICAN: 49 mL/min — AB (ref >60)
Glucose, Bld: 102 mg/dL — ABNORMAL HIGH (ref 65–99)
POTASSIUM: 3.7 mmol/L (ref 3.5–5.1)
SODIUM: 138 mmol/L (ref 135–145)

## 2016-07-14 LAB — URINALYSIS, ROUTINE W REFLEX MICROSCOPIC
BILIRUBIN URINE: NEGATIVE
GLUCOSE, UA: NEGATIVE mg/dL
Ketones, ur: NEGATIVE mg/dL
NITRITE: NEGATIVE
Protein, ur: 100 mg/dL — AB
SPECIFIC GRAVITY, URINE: 1.013 (ref 1.005–1.030)
pH: 6 (ref 5.0–8.0)

## 2016-07-14 NOTE — Evaluation (Addendum)
Occupational Therapy Evaluation Patient Details Name: Diamond Parsons MRN: [id number] DOB: 05/13/1960 Today's Date: 07/14/2016    History of Present Illness Pt adm from SNF with acute hypoxic resp failure requiring bipap and AKI requiring HD. Pt with acute encephalopathy. Pt also found to have C-diff. PMH - rt transmetatarsal amputation recently, morbid obesity, DM, CKD.   Clinical Impression   RN verified pt medically stable for OT evaluation. Pt presents with generalized weakness, decreased activity tolerance for ADL, and decreased cognition impacting her ability to participate in ADL tasks. She reports that she does not remember how much ADL assistance she required while she was participating in rehabilitation at SNF level but that prior to amputation she was independent with ADL. She currently requires mod assist for UB ADL, max assist for LB dressing, min guard assist for grooming tasks seated at EOB. She was able to participate in bathing, dressing, and grooming tasks seated at EOB for approximately 20 minutes this date and demonstrated significant fatigue following these activities requiring return to supine in bed to rest. Vital signs stable throughout session. Feel pt would best benefit from SNF level rehabilitation in order to maximize independence and safety with ADL and functional mobility. OT will continue to follow while admitted.    Follow Up Recommendations  SNF;Supervision/Assistance - 24 hour    Equipment Recommendations  Other (comment) (TBD at next venue of care)    Recommendations for Other Services       Precautions / Restrictions Precautions Precautions: Fall Restrictions Weight Bearing Restrictions: No Other Position/Activity Restrictions: Unsure of weight bearing restrictions on RLE due to amputation      Mobility Bed Mobility Overal bed mobility: Needs Assistance Bed Mobility: Rolling;Sidelying to Sit;Sit to Sidelying Rolling: Min assist Sidelying to sit: Mod  assist;+2 for safety/equipment     Sit to sidelying: Mod assist General bed mobility comments: Assist for raising trunk from bed and for management of B LE.   Transfers                      Balance Overall balance assessment: Needs assistance Sitting-balance support: No upper extremity supported;Feet supported Sitting balance-Leahy Scale: Fair Sitting balance - Comments: Pt participated in ADL tasks for approximately 15-20 minutes at EOB with stable vital signs with significant fatigue following this.                                    ADL either performed or assessed with clinical judgement   ADL Overall ADL's : Needs assistance/impaired Eating/Feeding: Set up;Bed level   Grooming: Wash/dry face;Min guard;Sitting   Upper Body Bathing: Moderate assistance;Sitting   Lower Body Bathing: Moderate assistance;Sitting/lateral leans   Upper Body Dressing : Moderate assistance;Sitting   Lower Body Dressing: Maximal assistance;Sitting/lateral leans       Toileting- Clothing Manipulation and Hygiene: Total assistance;Bed level         General ADL Comments: Unable to progress to OOB mobility as pt with significant fatigue following approximately 15-20 minutes of ADL activity seated at EOB and unclear R LE weight bearing status.      Vision   Vision Assessment?: No apparent visual deficits     Perception     Praxis      Pertinent Vitals/Pain Pain Assessment: Faces Faces Pain Scale: Hurts little more Pain Location: back Pain Descriptors / Indicators: Aching Pain Intervention(s): Limited activity within patient's tolerance;Monitored during session;Repositioned  Hand Dominance     Extremity/Trunk Assessment Upper Extremity Assessment Upper Extremity Assessment: Generalized weakness   Lower Extremity Assessment Lower Extremity Assessment: RLE deficits/detail;Generalized weakness RLE Deficits / Details: rt transmetatarsal amputation        Communication Communication Communication: No difficulties   Cognition Arousal/Alertness: Awake/alert Behavior During Therapy: WFL for tasks assessed/performed Overall Cognitive Status: Impaired/Different from baseline Area of Impairment: Orientation;Memory;Following commands;Safety/judgement;Problem solving                 Orientation Level: Disoriented to;Situation   Memory: Decreased short-term memory Following Commands: Follows one step commands consistently Safety/Judgement: Decreased awareness of deficits   Problem Solving: Slow processing;Decreased initiation;Requires verbal cues;Requires tactile cues General Comments: Pt with confusion while OT inquiring about PLOF. Additionally, pt with decreased awareness of deficits reporting she could ambulate to bathroom and later reporting that she has not been out of bed since amputation.    General Comments  Husband present and engaged in session.     Exercises     Shoulder Instructions      Home Living Family/patient expects to be discharged to:: Skilled nursing facility                                        Prior Functioning/Environment Level of Independence: Needs assistance  Gait / Transfers Assistance Needed: Pt with inconsistencies in report. Reporting ultimately to OT that she has not been out of bed since amputation. However, to PT reported she was transferring to chair with RW or lift.  ADL's / Homemaking Assistance Needed: Reports she does not remember how much she was able to participate in ADL at SNF. Prior to amputation was completing ADL with assistance for LB tasks.             OT Problem List: Decreased strength;Decreased activity tolerance;Impaired balance (sitting and/or standing);Decreased safety awareness;Decreased knowledge of use of DME or AE;Decreased knowledge of precautions;Cardiopulmonary status limiting activity;Obesity      OT Treatment/Interventions: Self-care/ADL  training;Therapeutic exercise;Energy conservation;DME and/or AE instruction;Therapeutic activities;Cognitive remediation/compensation;Patient/family education;Balance training    OT Goals(Current goals can be found in the care plan section) Acute Rehab OT Goals Patient Stated Goal: return home OT Goal Formulation: With patient Time For Goal Achievement: 07/28/16 Potential to Achieve Goals: Good ADL Goals Pt Will Perform Grooming: with modified independence;sitting Pt Will Transfer to Toilet: with +2 assist;stand pivot transfer;bedside commode;with mod assist Pt Will Perform Toileting - Clothing Manipulation and hygiene: with max assist;sit to/from stand;with 2+ total assist Pt/caregiver will Perform Home Exercise Program: Both right and left upper extremity;With written HEP provided;With Supervision;Increased strength Additional ADL Goal #1: Pt will complete bed mobility in preparation for ADL seated at EOB with overall min guard assist.   OT Frequency: Min 1X/week   Barriers to D/C:            Co-evaluation              AM-PAC PT "6 Clicks" Daily Activity     Outcome Measure Help from another person eating meals?: None Help from another person taking care of personal grooming?: A Little Help from another person toileting, which includes using toliet, bedpan, or urinal?: Total Help from another person bathing (including washing, rinsing, drying)?: A Lot Help from another person to put on and taking off regular upper body clothing?: A Lot Help from another person to put on and taking off regular  lower body clothing?: A Lot 6 Click Score: 14   End of Session Nurse Communication: Mobility status  Activity Tolerance: Patient tolerated treatment well;Patient limited by fatigue Patient left: in bed;with call bell/phone within reach;with family/visitor present  OT Visit Diagnosis: Muscle weakness (generalized) (M62.81);Other abnormalities of gait and mobility (R26.89);Other  symptoms and signs involving cognitive function                Time: 1525-1600 OT Time Calculation (min): 35 min Charges:  OT General Charges $OT Visit: 1 Procedure OT Evaluation $OT Eval Moderate Complexity: 1 Procedure OT Treatments $Self Care/Home Management : 8-22 mins G-Codes:     Doristine Section, MS OTR/L  Pager: [phone]   Jarmarcus Wambold A Milaya Hora 07/14/2016, 4:59 PM

## 2016-07-14 NOTE — Progress Notes (Signed)
Clinical Social Worker has been working with Diamond Parsons to try and get patient into their facility. Clinical[facility] request [facility] through insurance.   Marrianne MoodAshley Devita Nies, MSW,  [employer] [phone]

## 2016-07-14 NOTE — Progress Notes (Addendum)
PROGRESS NOTE    Diamond Parsons  ZOX:[id number] DOB: 1960/05/07 DOA: 07/07/2016 PCP: Patient, No Pcp Per   No chief complaint on file.   Brief Narrative:  HPI On 07/08/2016 by Dr. Midge Minium Diamond Parsons is a 55 y.o. female with history of diabetes mellitus, hypertension, hypothyroidism anemia and chronic kidney disease who was admitted last month for right foot infection underwent amputation and is on wound VAC and during that admission as per the reports patient had a couple of sessions dialysis and was eventually discharged to rehabilitation. Patient's routine lab work showed patient has hyperkalemia and was transferred to [facility].   Interim history Admitted for lethargy and abnormal labs from SNF. Started on hemodialysis for hyperkalemia. Also noted to have respiratory failure with hypercapnia and required BiPAP. AKI has improved. Now has Cdiff and on oral Vancomycin. Leukocytosis worsening today, will repeat CXR and UA.  Assessment & Plan   Acute hypoxic/hypercapnic respiratory failure -Likely in the setting of oh at bedtime, encephalopathy from uremia -Patient did require continuous BiPAP and was seen by PCCM -Patient has since been weaned off of BiPAP and currently not needing supplemental oxygen -patient maintaining oxygen sat upper 90s to 100% on room air  Acute kidney injury on chronic kidney disease, stage III complicated by hyperkalemia -Nephrology consult and appreciated, and has signed off  -Baseline creatinine approximate 1.2 -Creatinine upon admission was 5.6 -Patient required hemodialysis, 2 sessions -Potassium and creatinine improved -Creatinine today 1.37 -Will continue to monitor urine output (will order strict I/O)  C. difficile diarrhea -C. difficile antigen and toxin positive -Continue vancomycin -Patient currently has flexiseal place. Per nursing, output has improved mildly -Discussed with infectious disease, Dr. Drue Second, via phone. Would  continue oral vancomycin for now, would not use Questran. May consider deficid if no improvement in diarrhea -Stool output improved, however, none documented over past 24hrs -will speak with social work regarding discharging with flexiseal. If SNF will not accept, will discontinue flexiseal today and monitor stool output  Leukocytosis -Patient with increasing leukocytosis, possibly stress related vs infection vs reactive  -Obtain repeat chest x-ray this morning, in comparison to CXR on 6/23, stable. Patient does have vascular congestion, but appears stable -Ordered UA, pending -Will hold off on starting empiric antibiotics -Continue to monitor CBC  Acute encephalopathy, metabolic -Appears to be back to her baseline -Likely secondary to multifactorial causes including hypercapnia/hypoxia/uremia  Right metatarsal amputation  -Secondary to acute arterial ischemia and gangrene of the right foot, status post thrombectomy at [facility] -Previous hospitalist discussed with vascular surgery, Dr. Randie Heinz. ABI showed good circulation and transmetatarsal amputation site shows no dehiscence, no need for vascular or surgical intervention at this time. -Per previous hospitalist, no need for wound VAC. -Dr. Pascal Lux discussed with vascular surgeon at [facility], Dr. Sondra Come. Patient was on Eliquis for anticoagulation given her acute arterial ischemia. Xarelto has been saying in patients up to 209 kg, which may be more appropriate for this patient. Xarelto started on 6/25 -Patient will need to follow-up with her primary surgeons upon discharge -PT consulted- recommended SNF -SW consulted for SNF placement  Multiple skin wounds/sacral decubitus ulcer -Wound care consulted  Obstructive sleep apnea -Continue CPAP QHS  Diabetes mellitus, type II -Continue insulin sliding scale and CBG monitoring  Essential hypertension -Continue IV hydralazine as needed  Hypothyroidism -Continue  Synthroid -Patient had elevated TSH, low free T4, Synthroid was increased -Patient will need repeat labs in approximate 4 weeks  Depression -Continue BuSpar,  Zoloft  Normocytic Anemia -Unknown baseline hemoglobin, currently 9.3 -Suspect secondary to chronic kidney disease -Continue to monitor CBC  DVT Prophylaxis  Xarelto  Code Status: Full  Family Communication: Son at bedside  Disposition Plan: Admitted. Continue to monitor in stepdown. Pending SNF placement when diarrhea improves.   Consultants PCCM Vascular surgery Nephrology  Infectious disease via phone, Dr. Drue Second  Procedures  Insertion of temporary dialysis catheter  Antibiotics   Anti-infectives    Start     Dose/Rate Route Frequency Ordered Stop   07/10/16 1800  vancomycin (VANCOCIN) 50 mg/mL oral solution 125 mg     125 mg Oral Every 6 hours 07/10/16 1749        Subjective:   Diamond Parsons seen and examined today.  Patient feeling better this morning. Upset that her white count is elevated and that she could have another infection. Patient denies any cough, shortness of breath, chest pain, abdominal pain, nausea or vomiting, dizziness or headache. Feels her diarrhea has improved.   Objective:   Vitals:   07/13/16 2317 07/14/16 0322 07/14/16 0800 07/14/16 0825  BP: (!) 156/85 (!) 164/89  (!) 154/87  Pulse: 76 78 74 83  Resp: 17 16 20 17   Temp: 98.7 F (37.1 C) 98.4 F (36.9 C)  98.4 F (36.9 C)  TempSrc: Oral Oral  Oral  SpO2: 100% 100% 100%   Weight:      Height:        Intake/Output Summary (Last 24 hours) at 07/14/16 1026 Last data filed at 07/14/16 0800  Gross per 24 hour  Intake              660 ml  Output              500 ml  Net              160 ml   Filed Weights   07/09/16 0030 07/09/16 1350 07/09/16 1645  Weight: (!) 201 kg (443 lb 2 oz) (!) 204.4 kg (450 lb 9.9 oz) (!) 203.4 kg (448 lb 6.7 oz)   Exam  General: Well developed, well nourished, NAD, appears stated age  HEENT:  NCAT, mucous membranes moist.   Cardiovascular: S1 S2 auscultated, RRR, no murmurs  Respiratory: Diminshed but clear (difficult due to body habitus)  Abdomen: Soft, obese, nontender, nondistended, + bowel sounds  Extremities: warm dry without cyanosis clubbing or edema. Feet in prevnar boots, right foot with dressing in place.   Neuro: AAOx3, nonfocal  Psych: Normal affect and demeanor, pleasant  Data Reviewed: I have personally reviewed following labs and imaging studies  CBC:  Recent Labs Lab 07/08/16 0128  07/10/16 0456 07/11/16 0239 07/12/16 0246 07/13/16 0622 07/14/16 0525  WBC 10.7*  < > 10.7* 10.0 10.6* 13.6* 14.8*  NEUTROABS 8.3*  --   --   --   --   --   --   HGB 9.2*  < > 8.8* 8.7* 9.3* 9.0* 9.3*  HCT 30.6*  < > 30.4* 30.1* 31.0* 31.1* 31.9*  MCV 84.3  < > 85.9 85.3 84.9 85.2 84.8  PLT 620*  < > 470* 479* 457* 399 355  < > = values in this interval not displayed. Basic Metabolic Panel:  Recent Labs Lab 07/08/16 1102  07/10/16 0456 07/11/16 0239 07/12/16 0246 07/13/16 0622 07/14/16 0525  NA 130*  < > 135 139 140 141 138  K 6.1*  < > 4.5 4.1 3.9 3.8 3.7  CL 93*  < >  98* 102 103 103 102  CO2 24  < > 26 28 26 26 29   GLUCOSE 96  < > 79 83 108* 95 102*  BUN 90*  < > 46* 38* 28* 20 18  CREATININE 5.37*  < > 2.34* 1.67* 1.25* 1.33* 1.37*  CALCIUM 6.8*  < > 6.7* 6.8* 6.9* 6.9* 6.8*  PHOS 10.0*  --   --   --   --   --   --   < > = values in this interval not displayed. GFR: Estimated Creatinine Clearance: 83.6 mL/min (A) (by C-G formula based on SCr of 1.37 mg/dL (H)). Liver Function Tests:  Recent Labs Lab 07/08/16 0128 07/08/16 1102  AST 26  --   ALT 10*  --   ALKPHOS 84  --   BILITOT 0.4  --   PROT 7.5  --   ALBUMIN 2.4* 2.4*    Recent Labs Lab 07/10/16 0456  LIPASE 67*    Recent Labs Lab 07/08/16 0753  AMMONIA 29   Coagulation Profile: No results for input(s): INR, PROTIME in the last 168 hours. Cardiac Enzymes:  Recent Labs Lab  07/11/16 0239  CKTOTAL 230   BNP (last 3 results) No results for input(s): PROBNP in the last 8760 hours. HbA1C: No results for input(s): HGBA1C in the last 72 hours. CBG:  Recent Labs Lab 07/13/16 1208 07/13/16 1539 07/13/16 2109 07/14/16 0039 07/14/16 0408  GLUCAP 108* 115* 90 107* 92   Lipid Profile: No results for input(s): CHOL, HDL, LDLCALC, TRIG, CHOLHDL, LDLDIRECT in the last 72 hours. Thyroid Function Tests: No results for input(s): TSH, T4TOTAL, FREET4, T3FREE, THYROIDAB in the last 72 hours. Anemia Panel: No results for input(s): VITAMINB12, FOLATE, FERRITIN, TIBC, IRON, RETICCTPCT in the last 72 hours. Urine analysis: No results found for: COLORURINE, APPEARANCEUR, LABSPEC, PHURINE, GLUCOSEU, HGBUR, BILIRUBINUR, KETONESUR, PROTEINUR, UROBILINOGEN, NITRITE, LEUKOCYTESUR Sepsis Labs: @LABRCNTIP (procalcitonin:4,lacticidven:4)  ) Recent Results (from the past 240 hour(s))  MRSA PCR Screening     Status: None   Collection Time: 07/08/16  4:18 AM  Result Value Ref Range Status   MRSA by PCR NEGATIVE NEGATIVE Final    Comment:        The GeneXpert MRSA Assay (FDA approved for NASAL specimens only), is one component of a comprehensive MRSA colonization surveillance program. It is not intended to diagnose MRSA infection nor to guide or monitor treatment for MRSA infections.   C difficile quick scan w PCR reflex     Status: Abnormal   Collection Time: 07/10/16  2:35 PM  Result Value Ref Range Status   C Diff antigen POSITIVE (A) NEGATIVE Final   C Diff toxin POSITIVE (A) NEGATIVE Final   C Diff interpretation Toxin producing C. difficile detected.  Final    Comment: CRITICAL RESULT CALLED TO, READ BACK BY AND VERIFIED WITH: C WHITESIDE,RN AT 1743 07/10/16 BY L BENFIELD       Radiology Studies: Dg Chest Port 1 View  Result Date: 07/14/2016 CLINICAL DATA:  Elevated white blood cell count. Possible pneumonia. Recheck catheter position. EXAM: PORTABLE CHEST  1 VIEW COMPARISON:  Portable chest x-ray of July 10, 2016 FINDINGS: The lungs are adequately inflated. The interstitial markings are coarse though stable. There is no alveolar infiltrate or pleural effusion. The cardiac silhouette remains enlarged. The pulmonary vascularity remains mildly engorged. The right internal jugular venous catheter tip projects over the proximal third of the SVC. IMPRESSION: Mild CHF, stable.  No alveolar pneumonia. The right internal jugular venous  catheter tip projects over the proximal third of the SVC. Electronically Signed   By: David  [place] M.D.   On: 07/14/2016 07:41   Dg Abd Portable 1v  Result Date: 07/12/2016 CLINICAL DATA:  56 year old female with vomiting. Initial encounter. EXAM: PORTABLE ABDOMEN - 1 VIEW COMPARISON:  06/04/2016 CT. FINDINGS: Gas-filled slightly prominent size stomach Small bowel loop central abdomen with slightly thickened folds. Etiology/significance indeterminate. No other bowel abnormality noted. Post cholecystectomy. IMPRESSION: Gas-filled slightly prominent size stomach. Small bowel loop central abdomen with slightly thickened folds. Etiology/significance indeterminate. Electronically Signed   By: Lacy Duverney M.D.   On: 07/12/2016 18:59     Scheduled Meds: . busPIRone  5 mg Oral TID  . chlorhexidine  15 mL Mouth Rinse BID  . feeding supplement (PRO-STAT SUGAR FREE 64)  30 mL Oral TID BM  . insulin aspart  0-9 Units Subcutaneous Q4H  . levothyroxine  250 mcg Oral QAC breakfast  . mouth rinse  15 mL Mouth Rinse q12n4p  . rivaroxaban  20 mg Oral Q supper  . sertraline  100 mg Oral QHS  . vancomycin  125 mg Oral Q6H  . [START ON 07/16/2016] Vitamin D (Ergocalciferol)  50,000 Units Oral Q7 days   Continuous Infusions: . sodium chloride    . sodium chloride       LOS: 7 days   Time Spent in minutes   35 minutes  Ayliana Casciano D.O. on 07/14/2016 at 10:26 AM  Between 7am to 7pm - Pager - [phone]  After 7pm go to  [email or web address] - password [email or web address]  And look for the night coverage person covering for me after hours  [facility] Office  [phone]

## 2016-07-14 NOTE — Clinical Social Work Note (Addendum)
Authorization is still pending.  Charlynn CourtSarah Shruthi Northrup, CSW [phone]  4:03 pm Patient's PASARR expires on 7/4. CSW submitted new screening. New PASARR under manual review.  Charlynn CourtSarah Elin Fenley, CSW [phone]  4:49 pm Authorization still pending. Patient and husband updated.  Charlynn CourtSarah Zvi Duplantis, CSW [phone]

## 2016-07-15 DIAGNOSIS — R8271 Bacteriuria: Secondary | ICD-10-CM

## 2016-07-15 LAB — BASIC METABOLIC PANEL
Anion gap: 9 (ref 5–15)
BUN: 16 mg/dL (ref 6–20)
CHLORIDE: 102 mmol/L (ref 101–111)
CO2: 26 mmol/L (ref 22–32)
Calcium: 6.6 mg/dL — ABNORMAL LOW (ref 8.9–10.3)
Creatinine, Ser: 1.22 mg/dL — ABNORMAL HIGH (ref 0.44–1.00)
GFR calc Af Amer: 57 mL/min — ABNORMAL LOW (ref >60)
GFR calc non Af Amer: 49 mL/min — ABNORMAL LOW (ref >60)
GLUCOSE: 99 mg/dL (ref 65–99)
POTASSIUM: 3.8 mmol/L (ref 3.5–5.1)
Sodium: 137 mmol/L (ref 135–145)

## 2016-07-15 LAB — GLUCOSE, CAPILLARY
GLUCOSE-CAPILLARY: 108 mg/dL — AB (ref 65–99)
Glucose-Capillary: 100 mg/dL — ABNORMAL HIGH (ref 65–99)
Glucose-Capillary: 113 mg/dL — ABNORMAL HIGH (ref 65–99)
Glucose-Capillary: 121 mg/dL — ABNORMAL HIGH (ref 65–99)
Glucose-Capillary: 92 mg/dL (ref 65–99)

## 2016-07-15 LAB — CBC
HEMATOCRIT: 31.2 % — AB (ref 36.0–46.0)
HEMOGLOBIN: 9.3 g/dL — AB (ref 12.0–15.0)
MCH: 25 pg — AB (ref 26.0–34.0)
MCHC: 29.8 g/dL — AB (ref 30.0–36.0)
MCV: 83.9 fL (ref 78.0–100.0)
Platelets: 292 10*3/uL (ref 150–400)
RBC: 3.72 MIL/uL — AB (ref 3.87–5.11)
RDW: 21.1 % — ABNORMAL HIGH (ref 11.5–15.5)
WBC: 11.5 10*3/uL — ABNORMAL HIGH (ref 4.0–10.5)

## 2016-07-15 MED ORDER — PRO-STAT SUGAR FREE PO LIQD: 30.0000 mL | Freq: Three times a day (TID) | ORAL | 0 refills | Status: AC

## 2016-07-15 MED ORDER — LEVOTHYROXINE SODIUM 125 MCG PO TABS: 250.0000 ug | ORAL_TABLET | Freq: Every day | ORAL | Status: AC

## 2016-07-15 MED ORDER — OXYCODONE-ACETAMINOPHEN 10-325 MG PO TABS
2.0000 | ORAL_TABLET | ORAL | 0 refills | Status: AC | PRN
Start: 2016-07-15 — End: ?

## 2016-07-15 MED ORDER — VANCOMYCIN 50 MG/ML ORAL SOLUTION: 125.0000 mg | Freq: Four times a day (QID) | ORAL | 0 refills | Status: AC

## 2016-07-15 MED ORDER — RIVAROXABAN 20 MG PO TABS: 20.0000 mg | ORAL_TABLET | Freq: Every day | ORAL | Status: AC

## 2016-07-15 NOTE — Progress Notes (Signed)
[facility] spoke with [facility] [facility] -clinicals had been stuck in the wrong place so they had not started [place].  [employer] is getting clinicals to RN immediately and feels confident that they will give auth this afternoon  CSW updated facility and they are still able to accept  CSW will continue to follow  Burna SisJenna H. Dandy Lazaro, LCSW Clinical Social Worker [phone]

## 2016-07-15 NOTE — Progress Notes (Signed)
No bleeding noted on right IJ post hd cath site. Dressing in placed.

## 2016-07-15 NOTE — Progress Notes (Signed)
[facility], report given to RN, prescription and discharge instructions endorsed to ems, belongings with pt.

## 2016-07-15 NOTE — Progress Notes (Signed)
Seen by PT. Flexiseal came off and refused reinsertion, MD aware.

## 2016-07-15 NOTE — Plan of Care (Signed)
Problem: Safety: Goal: Ability to remain free from injury will improve Outcome: Progressing Pt will remain in bariatric pressure bed with bed alarm on.   Problem: Health Behavior/Discharge Planning: Goal: Ability to manage health-related needs will improve Outcome: Not Progressing Pt is still unable to manage her own health related needs.   Problem: Physical Regulation: Goal: Ability to maintain clinical measurements within normal limits will improve Outcome: Not Progressing Pt will need more education on what is within her normal limits.

## 2016-07-15 NOTE — Discharge Summary (Signed)
Physician Discharge Summary  Diamond Parsons ZOX:[id number] DOB: Jun 01, 1960 DOA: 07/07/2016  PCP: Patient, No Pcp Per  Admit date: 07/07/2016 Discharge date: 07/15/2016  Time spent: 45 minutes  Recommendations for Outpatient Follow-up:  Patient will be discharged to [facility], continue physical and occupational thearpy.  Patient will need to follow up with primary care provider within one week of discharge, repeat CBC and BMP. Repeat thyroid testing in 4 weeks. Follow up with Dr. Ilda Basset, orthopedics, in [facility]. Continue wound care. Patient should continue medications as prescribed.  Patient should follow a carb modified diet.   Discharge Diagnoses:  Acute hypoxic/hypercapnic respiratory failure Acute kidney injury on chronic kidney disease, stage III complicated by hyperkalemia C. difficile diarrhea Leukocytosis Asymptomatic bacteruria Acute encephalopathy, metabolic Right metatarsal amputation  Multiple skin wounds/sacral decubitus ulcer Obstructive sleep apnea Diabetes mellitus, type II Essential hypertension Hypothyroidism Depression Normocytic Anemia  Discharge Condition: Stable  Diet recommendation: Carb modified  Filed Weights   07/09/16 0030 07/09/16 1350 07/09/16 1645  Weight: (!) 201 kg (443 lb 2 oz) (!) 204.4 kg (450 lb 9.9 oz) (!) 203.4 kg (448 lb 6.7 oz)    History of present illness:  On 07/08/2016 by Dr. Stevan Born Johnsonis a 56 y.o.femalewith history of diabetes mellitus, hypertension, hypothyroidism anemia and chronic kidney disease who was admitted last month for right foot infection underwent amputation and is on wound VAC and during that admission as per the reports patient had a couple of sessions dialysis and was eventually discharged to rehabilitation. Patient's routine lab work showed patient has hyperkalemia and was transferred to [facility] Course:  Acute hypoxic/hypercapnic respiratory  failure -Likely in the setting of oh at bedtime, encephalopathy from uremia -Patient did require continuous BiPAP and was seen by PCCM -Patient has since been weaned off of BiPAP and currently not needing supplemental oxygen -patient maintaining oxygen sat upper 90s to 100% on room air  Acute kidney injury on chronic kidney disease, stage III complicated by hyperkalemia -Nephrology consult and appreciated, and has signed off  -Baseline creatinine approximate 1.2 -Creatinine upon admission was 5.6 -Patient required hemodialysis, 2 sessions -Potassium and creatinine improved -Creatinine today 1.22 -Patient has had good urine output over past 24 hours 1800cc  C. difficile diarrhea -C. difficile antigen and toxin positive -Continue vancomycin- will need additional 10 days (received 4 full days of therapy during hospitalization) -Patient currently has flexiseal place. Per nursing, output has improved mildly -Discussed with infectious disease, Dr. Drue Second, via phone. Would continue oral vancomycin for now, would not use Questran. May consider deficid if no improvement in diarrhea -Stool output improved, 350cc over past 24 hours  Leukocytosis -Patient with increasing leukocytosis, possibly stress related vs infection vs reactive  -Obtain repeat chest x-ray this morning, in comparison to CXR on 6/23, stable. Patient does have vascular congestion, but appears stable -UA showed many bacteria, 6-30WBC, large leukoyctes, however patient currently asymptomatic and leukocytosis improving   Asymptomatic bacteruria  -UA as noted above -Patient currently does not complain of dysuria or pyuria and is currently afebrile  -Will hold off on starting antibiotics given her current Cdiff infection - antibiotics may worsen her diarrhea  Acute encephalopathy, metabolic -Appears to be back to her baseline -Likely secondary to multifactorial causes including hypercapnia/hypoxia/uremia  Right metatarsal  amputation  -Secondary to acute arterial ischemia and gangrene of the right foot, status post thrombectomy at [facility] -Previous hospitalist discussed with vascular surgery, Dr. Randie Heinz. ABI showed good circulation  and transmetatarsal amputation site shows no dehiscence, no need for vascular or surgical intervention at this time. -Per previous hospitalist, no need for wound VAC. -Dr. Pascal Lux discussed with vascular surgeon at [facility], Dr. Sondra Come. Patient was on Eliquis for anticoagulation given her acute arterial ischemia. Xarelto has been saying in patients up to 209 kg, which may be more appropriate for this patient. Xarelto started on 6/25 -Patient will need to follow-up with her primary surgeons upon discharge -PT consulted- recommended SNF -SW consulted for SNF placement -Patient will need to follow up with Dr. Jearl Klinefelter at [facility] for staple removal   Multiple skin wounds/sacral decubitus ulcer -Wound care consulted  Obstructive sleep apnea -Continue CPAP QHS  Diabetes mellitus, type II -Continue insulin sliding scale and CBG monitoring  Essential hypertension -Continue IV hydralazine as needed  Hypothyroidism -Continue Synthroid -Patient had elevated TSH, low free T4, Synthroid was increased -Patient will need repeat labs in approximate 4 weeks  Depression -Continue BuSpar, Zoloft  Normocytic Anemia -Unknown baseline hemoglobin, currently 9.3 -Suspect secondary to chronic kidney disease -Continue to monitor CBC  Consultants PCCM Vascular surgery Nephrology  Infectious disease via phone, Dr. Drue Second  Procedures  Insertion of temporary dialysis catheter  Discharge Exam: Vitals:   07/15/16 0422 07/15/16 0745  BP:  (!) 154/83  Pulse:  77  Resp:  19  Temp: 99 F (37.2 C) 98.6 F (37 C)   Patient states she is feeling better today. Feels diarrhea has improved. Denies chest pain, shortness of breath, abdominal pain,  nausea, vomiting.    General: Well developed, well nourished, NAD, appears stated age  HEENT: NCAT,mucous membranes moist.  Cardiovascular: S1 S2 auscultated, no rubs, murmurs or gallops. Regular rate and rhythm.  Respiratory: Clear to auscultation bilaterally   Abdomen: Soft, obese, nontender, nondistended, + bowel sounds  Extremities: warm dry without cyanosis clubbing or edema. Right transmetatarsal amputation with dressing in place.  Neuro: AAOx3, nonfocal  Psych: Appropriate mood and affect  Discharge Instructions Discharge Instructions    Discharge instructions    Complete by:  As directed    Patient will be discharged to [facility], continue physical and occupational thearpy.  Patient will need to follow up with primary care provider within one week of discharge, repeat CBC and BMP. Repeat thyroid testing in 4 weeks. Follow up with Dr. Ilda Basset, orthopedics, in [facility]. Continue wound care. Patient should continue medications as prescribed.  Patient should follow a carb modified diet.     Current Discharge Medication List    START taking these medications   Details  Amino Acids-Protein Hydrolys (FEEDING SUPPLEMENT, PRO-STAT SUGAR FREE 64,) LIQD Take 30 mLs by mouth 3 (three) times daily between meals. Qty: 900 mL, Refills: 0    rivaroxaban (XARELTO) 20 MG TABS tablet Take 1 tablet (20 mg total) by mouth daily with supper. Qty: 30 tablet    vancomycin (VANCOCIN) 50 mg/mL oral solution Take 2.5 mLs (125 mg total) by mouth every 6 (six) hours. Qty: 100 mL, Refills: 0      CONTINUE these medications which have CHANGED   Details  levothyroxine (SYNTHROID, LEVOTHROID) 125 MCG tablet Take 2 tablets (250 mcg total) by mouth daily before breakfast.    oxyCODONE-acetaminophen (PERCOCET) 10-325 MG tablet Take 2 tablets by mouth every 4 (four) hours as needed for pain. Qty: 20 tablet, Refills: 0      CONTINUE these medications which have NOT CHANGED     Details  albuterol (PROVENTIL  HFA;VENTOLIN HFA) 108 (90 Base) MCG/ACT inhaler Inhale 2 puffs into the lungs every 4 (four) hours as needed for wheezing or shortness of breath.    busPIRone (BUSPAR) 5 MG tablet Take 5 mg by mouth 3 (three) times daily.    CALCIUM CARBONATE PO Take 4 tablets by mouth 2 (two) times daily.    ergocalciferol (VITAMIN D2) 50000 units capsule Take 50,000 Units by mouth every Friday.    fluticasone (FLONASE) 50 MCG/ACT nasal spray Place 1 spray into both nostrils daily.    Insulin Glargine (BASAGLAR KWIKPEN) 100 UNIT/ML SOPN Inject 20 Units into the skin daily at 10 pm.    insulin lispro (HUMALOG) 100 UNIT/ML injection Inject 4-12 Units into the skin See admin instructions. Inject 4-12 units SQ before meals and at bedtime per sliding scale. Must take finger stick blood glucose prior to administration. If BS <60 call MD/NP, 150-200 = 4, 201-250 = 6 units, 251-300 = 8 units, 301-350 = 10 units, 351-400 = 12 units, if >400 call MD/NP    insulin regular (NOVOLIN R,HUMULIN R) 100 units/mL injection Inject 5 Units into the skin 3 (three) times daily before meals.    metoprolol succinate (TOPROL-XL) 50 MG 24 hr tablet Take 50 mg by mouth daily. Take with or immediately following a meal.    sertraline (ZOLOFT) 100 MG tablet Take 100 mg by mouth at bedtime.      STOP taking these medications     apixaban (ELIQUIS) 5 MG TABS tablet      gabapentin (NEURONTIN) 300 MG capsule      QUEtiapine (SEROQUEL) 100 MG tablet        No Known Allergies  Contact information for follow-up providers    Susa Raring., MD Follow up.   Specialty:  Sports Medicine Why:  Follow-up after discharge regarding staples removal and wound in [place] area Contact information: [address] [place] [address] [phone]            Contact information for after-discharge care    Destination    [facility] Follow up.   Specialty:   Skilled Nursing Facility Contact information: [address] [place] [address] [phone]                   The results of significant diagnostics from this hospitalization (including imaging, microbiology, ancillary and laboratory) are listed below for reference.    Significant Diagnostic Studies: Ct Head Wo Contrast  Result Date: 07/09/2016 CLINICAL DATA:  Initial evaluation for acute altered mental status. EXAM: CT HEAD WITHOUT CONTRAST TECHNIQUE: Contiguous axial images were obtained from the base of the skull through the vertex without intravenous contrast. COMPARISON:  Prior CT from 06/03/2016. FINDINGS: Brain: Study limited by patient positioning and extensive motion artifact. Cerebral volume within normal limits. No obvious acute intracranial hemorrhage. Gray-white matter differentiation grossly maintained without definite evidence for acute large vessel territory infarct. No mass lesion, midline shift or mass effect. No hydrocephalus. No appreciable extra-axial fluid collection on this severely motion degraded exam. Vascular: No hyperdense vessel. Skull: Scalp soft tissues within normal limits. Calvarium grossly intact. Sinuses/Orbits: Globes and orbital soft tissues within normal limits. Paranasal sinuses are clear. No mastoid effusion. IMPRESSION: The Severely motion degraded exam. No obvious acute intracranial process identified. Electronically Signed   By: Rise Mu M.D.   On: 07/09/2016 04:54   Dg Chest Port 1 View  Result Date: 07/14/2016 CLINICAL DATA:  Elevated white blood  cell count. Possible pneumonia. Recheck catheter position. EXAM: PORTABLE CHEST 1 VIEW COMPARISON:  Portable chest x-ray of July 10, 2016 FINDINGS: The lungs are adequately inflated. The interstitial markings are coarse though stable. There is no alveolar infiltrate or pleural effusion. The cardiac silhouette remains enlarged. The pulmonary vascularity remains mildly  engorged. The right internal jugular venous catheter tip projects over the proximal third of the SVC. IMPRESSION: Mild CHF, stable.  No alveolar pneumonia. The right internal jugular venous catheter tip projects over the proximal third of the SVC. Electronically Signed   By: David  SwazilandJordan M.D.   On: 07/14/2016 07:41   Dg Chest Port 1 View  Result Date: 07/10/2016 CLINICAL DATA:  Respiratory failure as well as chronic renal failure. EXAM: PORTABLE CHEST 1 VIEW COMPARISON:  07/08/2016 FINDINGS: Right IJ central venous catheter unchanged with tip over the SVC. Lungs are somewhat hypoinflated with mild stable prominence of the perihilar markings likely mild vascular congestion. No lobar consolidation or effusion. Stable cardiomegaly. Remainder of the exam is unchanged. IMPRESSION: Stable cardiomegaly with mild stable vascular congestion. Right IJ central venous catheter unchanged. Electronically Signed   By: Elberta Fortisaniel  Boyle M.D.   On: 07/10/2016 08:22   Dg Chest Port 1 View  Result Date: 07/08/2016 CLINICAL DATA:  Encounter for central line placement. EXAM: PORTABLE CHEST 1 VIEW COMPARISON:  Radiograph of same day. FINDINGS: Stable cardiomegaly with central pulmonary vascular congestion. No pneumothorax or pleural effusion is noted. Interval placement of right internal jugular catheter with distal tip in expected position of SVC. Bony thorax is unremarkable. IMPRESSION: Stable cardiomegaly with central pulmonary vascular congestion. Interval placement of right internal jugular catheter with distal tip in expected position of SVC. Electronically Signed   By: Lupita RaiderJames  Green Jr, M.D.   On: 07/08/2016 13:16   Dg Chest Port 1 View  Result Date: 07/08/2016 CLINICAL DATA:  Hypoxia EXAM: PORTABLE CHEST 1 VIEW COMPARISON:  None. FINDINGS: There is no edema or consolidation. There is cardiomegaly with pulmonary venous hypertension. No adenopathy. Moderate air is noted in the upper esophagus. Visualized trachea appears  unremarkable. No bone lesions. IMPRESSION: Evidence pulmonary vascular congestion without edema or consolidation. Air in upper esophagus of uncertain etiology. Electronically Signed   By: Bretta BangWilliam  Woodruff III M.D.   On: 07/08/2016 08:11   Dg Abd Portable 1v  Result Date: 07/12/2016 CLINICAL DATA:  56 year old female with vomiting. Initial encounter. EXAM: PORTABLE ABDOMEN - 1 VIEW COMPARISON:  06/04/2016 CT. FINDINGS: Gas-filled slightly prominent size stomach Small bowel loop central abdomen with slightly thickened folds. Etiology/significance indeterminate. No other bowel abnormality noted. Post cholecystectomy. IMPRESSION: Gas-filled slightly prominent size stomach. Small bowel loop central abdomen with slightly thickened folds. Etiology/significance indeterminate. Electronically Signed   By: Lacy DuverneySteven  Olson M.D.   On: 07/12/2016 18:59    Microbiology: Recent Results (from the past 240 hour(s))  MRSA PCR Screening     Status: None   Collection Time: 07/08/16  4:18 AM  Result Value Ref Range Status   MRSA by PCR NEGATIVE NEGATIVE Final    Comment:        The GeneXpert MRSA Assay (FDA approved for NASAL specimens only), is one component of a comprehensive MRSA colonization surveillance program. It is not intended to diagnose MRSA infection nor to guide or monitor treatment for MRSA infections.   C difficile quick scan w PCR reflex     Status: Abnormal   Collection Time: 07/10/16  2:35 PM  Result Value Ref Range Status   C  Diff antigen POSITIVE (A) NEGATIVE Final   C Diff toxin POSITIVE (A) NEGATIVE Final   C Diff interpretation Toxin producing C. difficile detected.  Final    Comment: CRITICAL RESULT CALLED TO, READ BACK BY AND VERIFIED WITH: C WHITESIDE,RN AT 1743 07/10/16 BY L BENFIELD      Labs: Basic Metabolic Panel:  Recent Labs Lab 07/11/16 0239 07/12/16 0246 07/13/16 0622 07/14/16 0525 07/15/16 0800  NA 139 140 141 138 137  K 4.1 3.9 3.8 3.7 3.8  CL 102 103 103  102 102  CO2 28 26 26 29 26   GLUCOSE 83 108* 95 102* 99  BUN 38* 28* 20 18 16   CREATININE 1.67* 1.25* 1.33* 1.37* 1.22*  CALCIUM 6.8* 6.9* 6.9* 6.8* 6.6*   Liver Function Tests: No results for input(s): AST, ALT, ALKPHOS, BILITOT, PROT, ALBUMIN in the last 168 hours.  Recent Labs Lab 07/10/16 0456  LIPASE 67*   No results for input(s): AMMONIA in the last 168 hours. CBC:  Recent Labs Lab 07/11/16 0239 07/12/16 0246 07/13/16 0622 07/14/16 0525 07/15/16 0800  WBC 10.0 10.6* 13.6* 14.8* 11.5*  HGB 8.7* 9.3* 9.0* 9.3* 9.3*  HCT 30.1* 31.0* 31.1* 31.9* 31.2*  MCV 85.3 84.9 85.2 84.8 83.9  PLT 479* 457* 399 355 292   Cardiac Enzymes:  Recent Labs Lab 07/11/16 0239  CKTOTAL 230   BNP: BNP (last 3 results)  Recent Labs  07/08/16 0753  BNP 76.1    ProBNP (last 3 results) No results for input(s): PROBNP in the last 8760 hours.  CBG:  Recent Labs Lab 07/14/16 1728 07/14/16 1957 07/15/16 0030 07/15/16 0414 07/15/16 0747  GLUCAP 112* 118* 92 100* 108*       Signed:  Campbell Kray  [facility] 07/15/2016, 11:20 AM

## 2016-07-15 NOTE — Clinical Social Work Placement (Signed)
   CLINICAL SOCIAL WORK PLACEMENT  NOTE  Date:  07/15/2016  Patient Details  Name: Diamond Parsons MRN: [id number] Date of Birth: 01/12/1961  Clinical Social Work is seeking post-discharge placement for this patient at the Skilled  Nursing Facility level of care (*CSW will initial, date and re-position this form in  chart as items are completed):  Yes   Patient/family provided with [place] Clinical Social Work Department's list of facilities offering this level of care within the geographic area requested by the patient (or if unable, by the patient's family).  Yes   Patient/family informed of their freedom to choose among providers that offer the needed level of care, that participate in Medicare, Medicaid or managed care program needed by the patient, have an available bed and are willing to accept the patient.  Yes   Patient/family informed of [facility]'s ownership interest in [facility] and [facility], as well as of the fact that they are under no obligation to receive care at these facilities.  PASRR submitted to EDS on       PASRR number received on       Existing PASRR number confirmed on 07/13/16     FL2 transmitted to all facilities in geographic area requested by pt/family on 07/13/16     FL2 transmitted to all facilities within larger geographic area on       Patient informed that his/her managed care company has contracts with or will negotiate with certain facilities, including the following:        Yes   Patient/family informed of bed offers received.  [facility]     Physician recommends and patient chooses bed at      [facility].  Patient to be transferred to facility by ptar     Patient family notified on 07/15/16 of transfer.  Name of family member notified:        PHYSICIAN       Additional Comment:     _______________________________________________ Burna SisUris, Azarah Dacy H, LCSW 07/15/2016, 4:52 PM

## 2016-07-15 NOTE — Progress Notes (Signed)
Patient will discharge to [facility] Anticipated discharge date: 6/28 Transportation by [facility]- called at 4:30pm- they are running about 1.5 hours behind Report #: [phone] [id number] [id number]  CSW signing off.  Burna SisJenna H. Natnael Biederman, LCSW Clinical Social Worker [phone]

## 2016-07-15 NOTE — Progress Notes (Signed)
Physical Therapy Treatment Patient Details Name: Diamond Parsons MRN: [id number] DOB: 1960-09-05 Today's Date: 07/15/2016    History of Present Illness Pt adm from SNF with acute hypoxic resp failure requiring bipap and AKI requiring HD. Pt with acute encephalopathy. Pt also found to have C-diff. PMH - rt transmetatarsal amputation recently, morbid obesity, DM, CKD.    PT Comments    Patient unable to mobilize to EOB due to flexiseal out and large amount of stool under her.  Assisted staff to roll and clean and pt able to assist to scoot up in bed.  Feel continued skilled PT in the acute setting indicated to progress to OOB.  PT confirmed NWB with SNF pt admitted from [facility].   Follow Up Recommendations  SNF     Equipment Recommendations  Other (comment) (TBA)    Recommendations for Other Services       Precautions / Restrictions Precautions Precautions: Fall Restrictions Weight Bearing Restrictions: Yes RLE Weight Bearing: Non weight bearing Other Position/Activity Restrictions: per facility pt transferred from    Mobility  Bed Mobility Overal bed mobility: Needs Assistance Bed Mobility: Rolling Rolling: Mod assist         General bed mobility comments: rolling with rail, session spent assisting NT with cleaning due to finding pt with stool due to flexiseal out; assisted to scoot up in bed pulling up on rails and pushing with L foot on bed  Transfers                    Ambulation/Gait                 Stairs            Wheelchair Mobility    Modified Rankin (Stroke Patients Only)       Balance                                            Cognition Arousal/Alertness: Awake/alert Behavior During Therapy: WFL for tasks assessed/performed Overall Cognitive Status: Within Functional Limits for tasks assessed                                        Exercises      General Comments         Pertinent Vitals/Pain Pain Assessment: Faces Faces Pain Scale: Hurts even more Pain Location: bottom due to sores and cleaning during sesion Pain Descriptors / Indicators: Tender;Sore Pain Intervention(s): Monitored during session;Repositioned    Home Living                      Prior Function            PT Goals (current goals can now be found in the care plan section) Progress towards PT goals: Not progressing toward goals - comment (due to flexiseal out, soiled in bed, )    Frequency    Min 3X/week      PT Plan Current plan remains appropriate    Co-evaluation              AM-PAC PT "6 Clicks" Daily Activity  Outcome Measure  Difficulty turning over in bed (including adjusting bedclothes, sheets and blankets)?: Total Difficulty moving from lying on back to sitting on the side  of the bed? : Total Difficulty sitting down on and standing up from a chair with arms (e.g., wheelchair, bedside commode, etc,.)?: Total Help needed moving to and from a bed to chair (including a wheelchair)?: Total Help needed walking in hospital room?: Total Help needed climbing 3-5 steps with a railing? : Total 6 Click Score: 6    End of Session   Activity Tolerance: Treatment limited secondary to medical complications (Comment) (due to flexiseal out) Patient left: in bed   PT Visit Diagnosis: Muscle weakness (generalized) (M62.81);Difficulty in walking, not elsewhere classified (R26.2)     Time: [id number] PT Time Calculation (min) (ACUTE ONLY): 36 min  Charges:  $Therapeutic Activity: 23-37 mins                    G CodesSheran Parsons:       Diamond Parsons, [place] CarolinaPT [phone] 07/15/2016    Diamond Parsons 07/15/2016, 12:41 PM

## 2016-07-15 NOTE — Care Management Important Message (Signed)
Important Message  Patient Details  Name: Diamond Parsons MRN: [id number] Date of Birth: 12/29/1960   Medicare Important Message Given:  Yes    Cherylann ParrClaxton, Afnan Cadiente S, RN 07/15/2016, 3:06 PM

## 2017-08-18 DEATH — deceased

## 2018-09-16 IMAGING — DX DG CHEST 1V PORT
1 series · 1 of 1 positions shown · non-contrast
Comparison: Radiograph of same day.

CLINICAL DATA: Encounter for central line placement.

EXAM:
PORTABLE CHEST 1 VIEW

[chest ap]
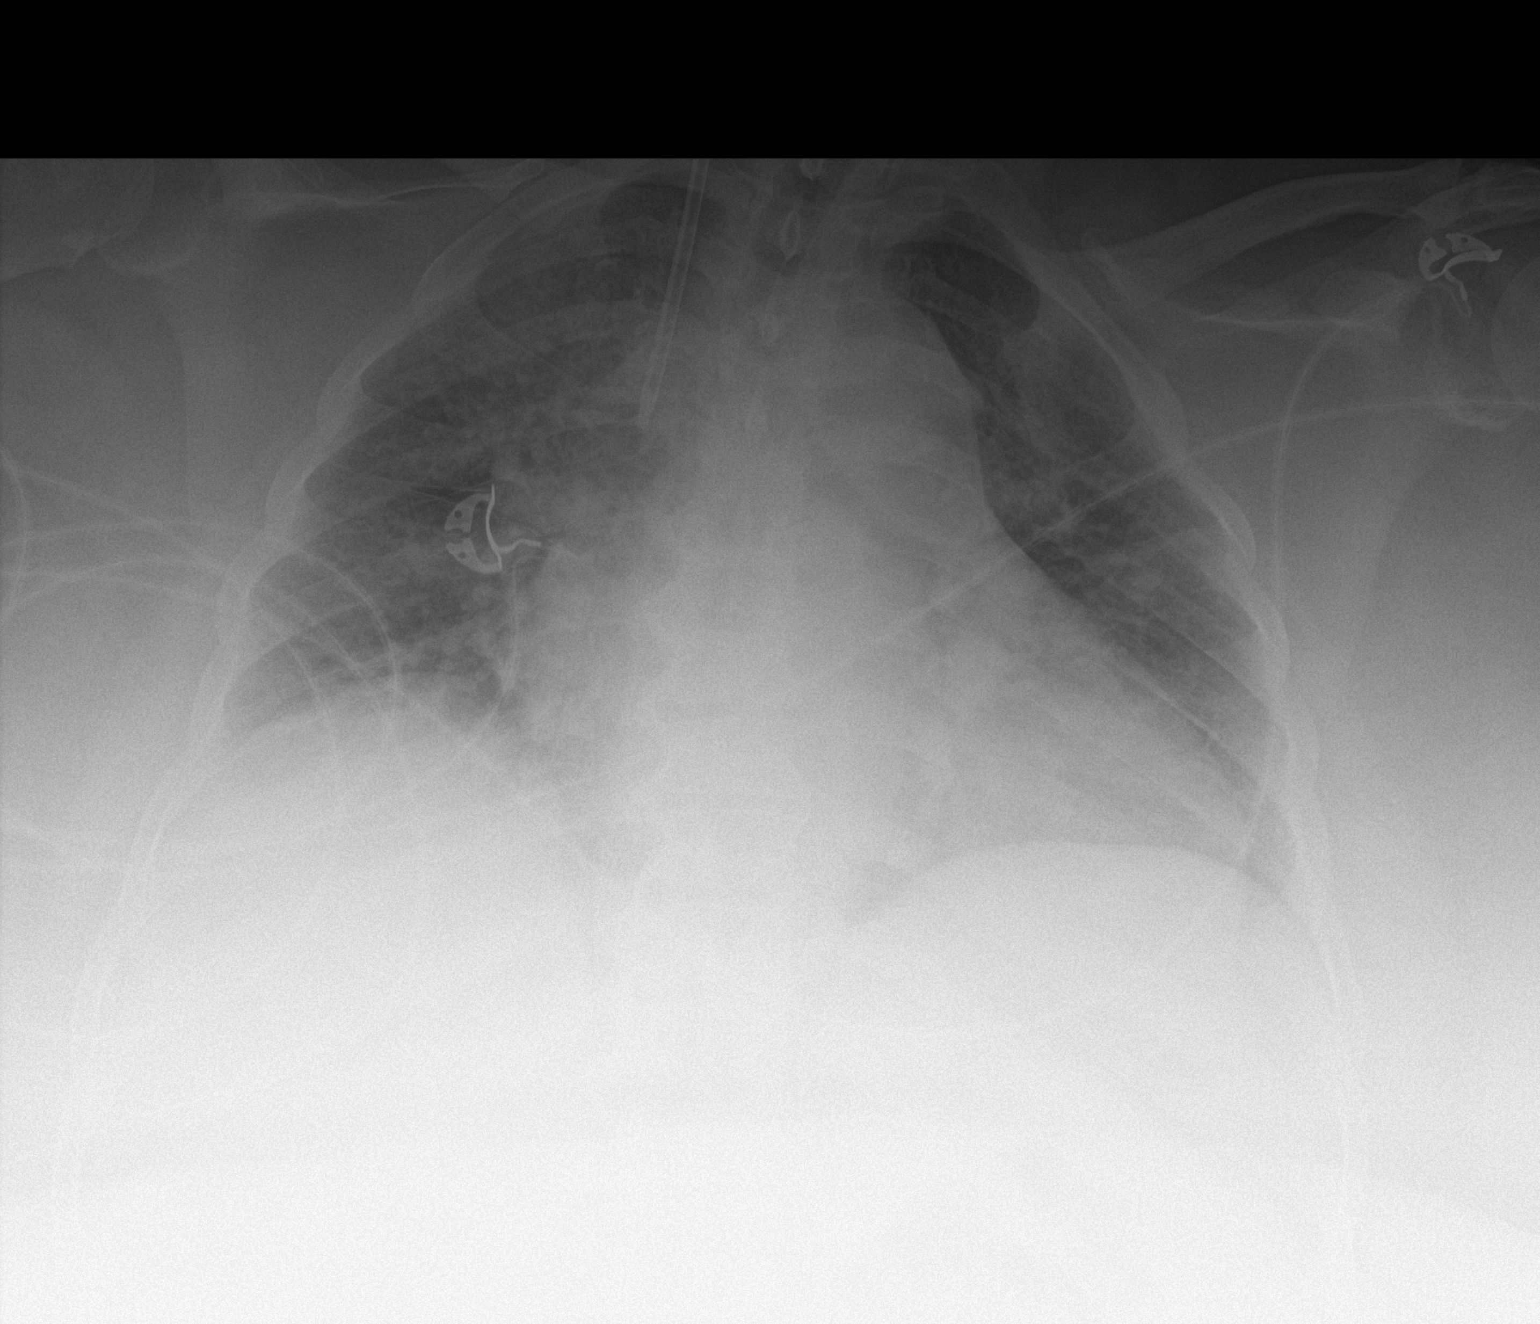

[1 of 1 positions shown; findings below may reference images not displayed]

FINDINGS: Stable cardiomegaly with central pulmonary vascular congestion. No
pneumothorax or pleural effusion is noted. Interval placement of
right internal jugular catheter with distal tip in expected position
of SVC. Bony thorax is unremarkable.
IMPRESSION: Stable cardiomegaly with central pulmonary vascular congestion.
Interval placement of right internal jugular catheter with distal
tip in expected position of SVC.

## 2018-09-17 IMAGING — CT CT HEAD W/O CM
3 of 12 series · 12 of 47 positions shown, 14 images · non-contrast
Comparison: Prior CT from 06/03/2016.

CLINICAL DATA: Initial evaluation for acute altered mental status.

EXAM:
CT HEAD WITHOUT CONTRAST
TECHNIQUE: Contiguous axial images were obtained from the base of the skull
through the vertex without intravenous contrast.

[Series 4: head 2.0 h70h · axial · 0.40mm/px · z∈[-482,-336]mm · 8 of 95 slices shown, 10 images]
[im 11/95  brain]
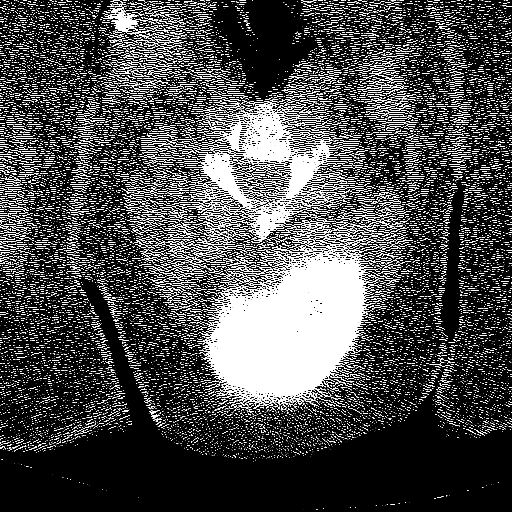
[im 11/95  bone]
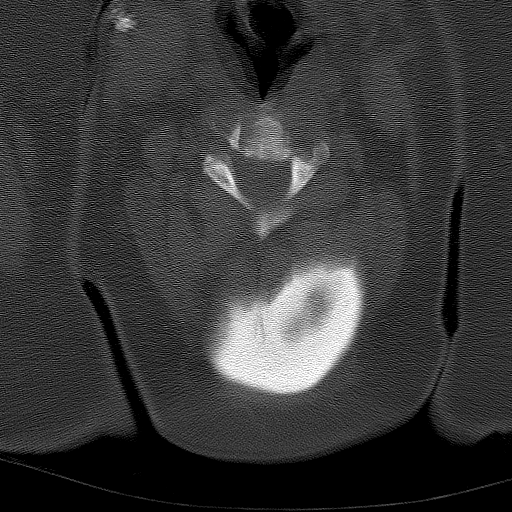
[im 21/95  brain]
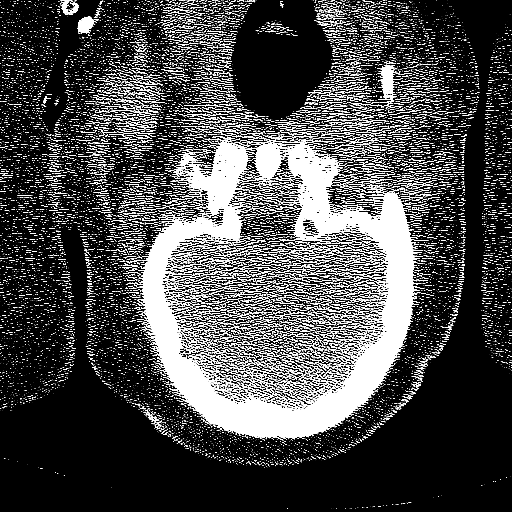
[im 32/95  brain]
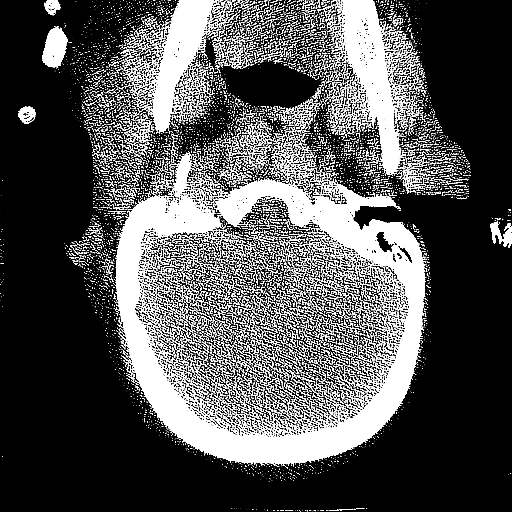
[im 42/95  brain]
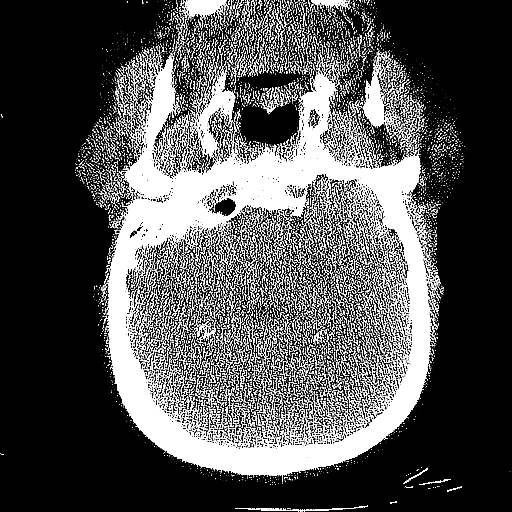
[im 53/95  brain]
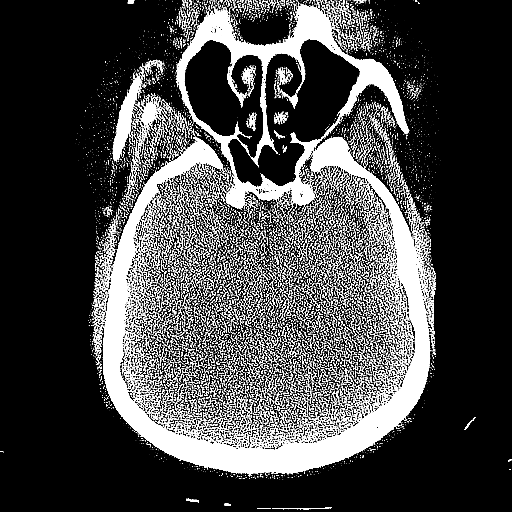
[im 53/95  bone]
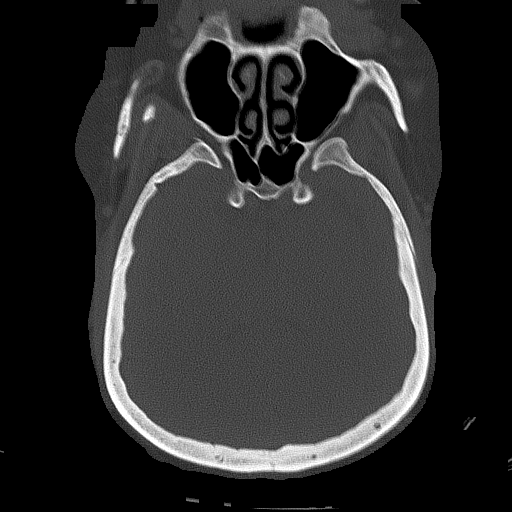
[im 63/95  brain]
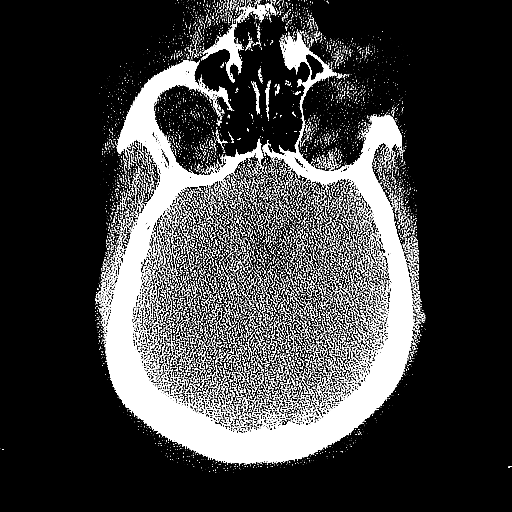
[im 74/95  brain]
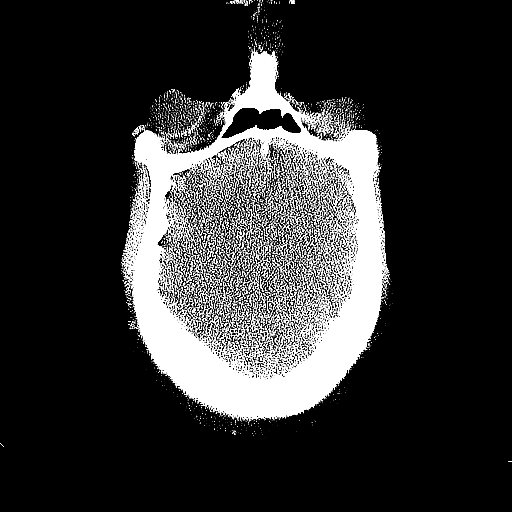
[im 84/95  brain]
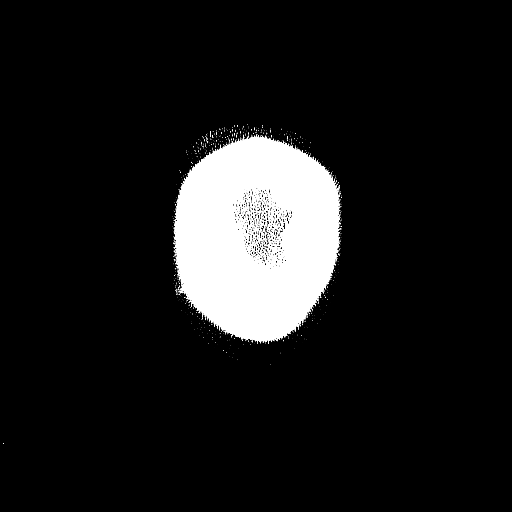

[Series 10: head 3.0 mpr sag · sagittal · 0.37mm/px · 1 of 67 slices shown]
[im 34/67  brain]
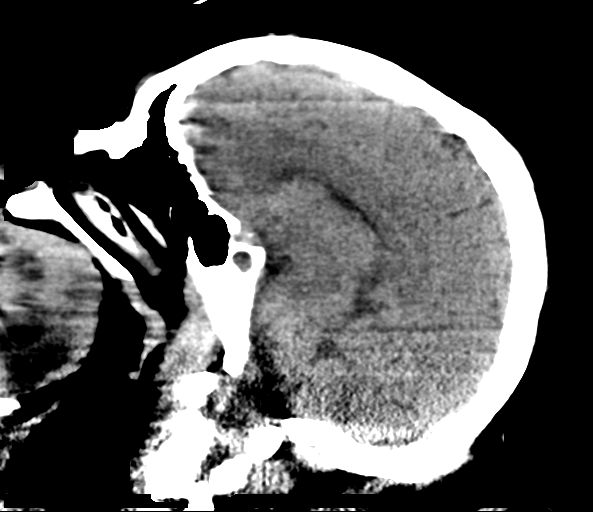

[Series 15: head 3.0 mpr cor · coronal · 0.20mm/px · 3 of 39 slices shown]
[im 8/39  brain]
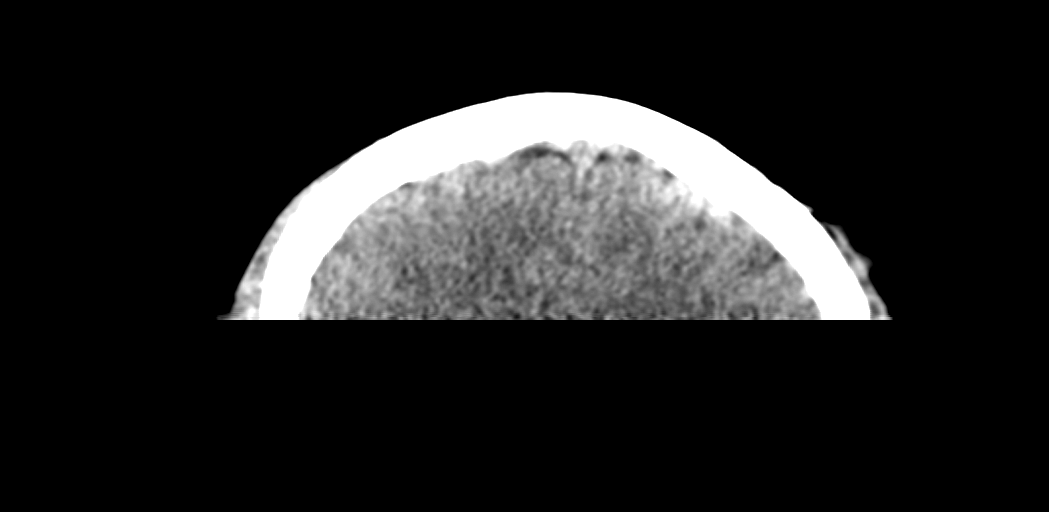
[im 16/39  brain]
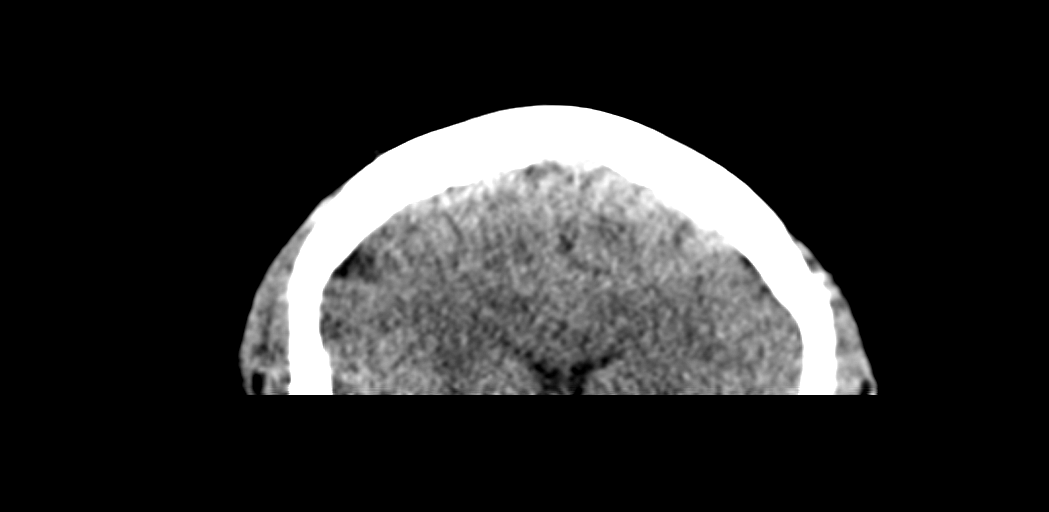
[im 23/39  brain]
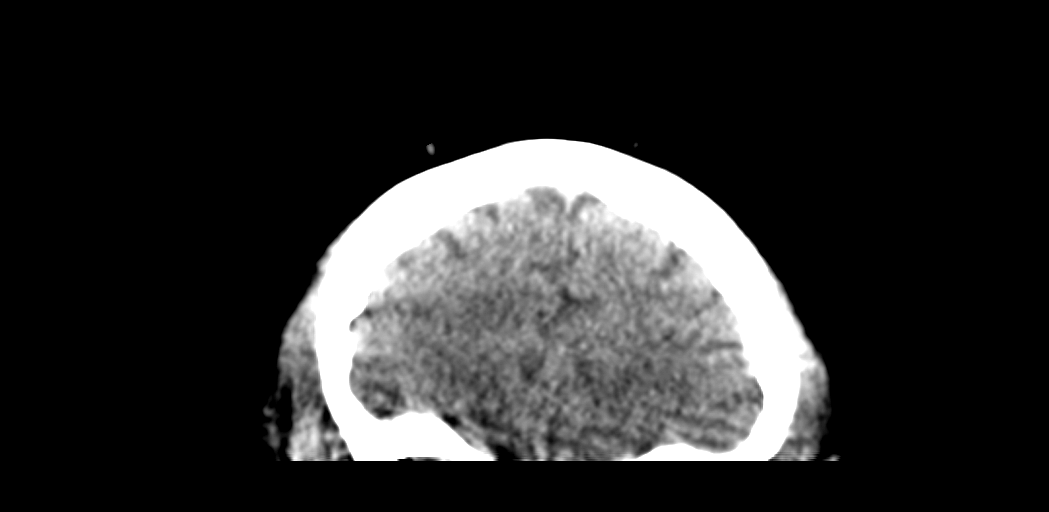

[12 of 47 positions shown; findings below may reference images not displayed]

FINDINGS: Brain: Study limited by patient positioning and extensive motion
artifact.

Cerebral volume within normal limits. No obvious acute intracranial
hemorrhage. Gray-white matter differentiation grossly maintained
without definite evidence for acute large vessel territory infarct.
No mass lesion, midline shift or mass effect. No hydrocephalus. No
appreciable extra-axial fluid collection on this severely motion
degraded exam.

Vascular: No hyperdense vessel.

Skull: Scalp soft tissues within normal limits. Calvarium grossly
intact.

Sinuses/Orbits: Globes and orbital soft tissues within normal
limits. Paranasal sinuses are clear. No mastoid effusion.
IMPRESSION: The Severely motion degraded exam. No obvious acute intracranial
process identified.
